# Patient Record
Sex: Female | Born: 1935 | Race: White | Hispanic: No | Smoking: Never smoker
Health system: Southern US, Community
[De-identification: ages and names within clinical notes are randomized; demographics above are authoritative.]

## PROBLEM LIST (undated history)

## (undated) DIAGNOSIS — F419 Anxiety disorder, unspecified: Secondary | ICD-10-CM

## (undated) DIAGNOSIS — F0390 Unspecified dementia without behavioral disturbance: Secondary | ICD-10-CM

## (undated) HISTORY — PX: TONSILLECTOMY: SUR1361

## (undated) HISTORY — PX: ABDOMINAL HYSTERECTOMY: SHX81

---

## 2015-07-02 ENCOUNTER — Emergency Department
Admission: EM | Admit: 2015-07-02 | Discharge: 2015-07-03 | Disposition: A | Discharge status: Home / Self Care | Payer: Medicare Other | Attending: Emergency Medicine | Admitting: Emergency Medicine

## 2015-07-02 ENCOUNTER — Encounter: Payer: Self-pay | Admitting: *Deleted

## 2015-07-02 ENCOUNTER — Emergency Department: Payer: Medicare Other

## 2015-07-02 DIAGNOSIS — G308 Other Alzheimer's disease: Secondary | ICD-10-CM | POA: Diagnosis not present

## 2015-07-02 DIAGNOSIS — Z046 Encounter for general psychiatric examination, requested by authority: Secondary | ICD-10-CM | POA: Diagnosis present

## 2015-07-02 DIAGNOSIS — F1310 Sedative, hypnotic or anxiolytic abuse, uncomplicated: Secondary | ICD-10-CM | POA: Diagnosis not present

## 2015-07-02 DIAGNOSIS — Z9183 Wandering in diseases classified elsewhere: Secondary | ICD-10-CM | POA: Diagnosis not present

## 2015-07-02 DIAGNOSIS — F0391 Unspecified dementia with behavioral disturbance: Secondary | ICD-10-CM | POA: Insufficient documentation

## 2015-07-02 DIAGNOSIS — F0281 Dementia in other diseases classified elsewhere with behavioral disturbance: Secondary | ICD-10-CM | POA: Diagnosis present

## 2015-07-02 DIAGNOSIS — N390 Urinary tract infection, site not specified: Secondary | ICD-10-CM | POA: Diagnosis not present

## 2015-07-02 DIAGNOSIS — G309 Alzheimer's disease, unspecified: Secondary | ICD-10-CM

## 2015-07-02 DIAGNOSIS — R451 Restlessness and agitation: Secondary | ICD-10-CM

## 2015-07-02 LAB — URINALYSIS COMPLETE WITH MICROSCOPIC (ARMC ONLY)
BILIRUBIN URINE: NEGATIVE
GLUCOSE, UA: NEGATIVE mg/dL
Ketones, ur: NEGATIVE mg/dL
Nitrite: NEGATIVE
PH: 6 (ref 5.0–8.0)
PROTEIN: 30 mg/dL — AB
Specific Gravity, Urine: 1.011 (ref 1.005–1.030)

## 2015-07-02 LAB — URINE DRUG SCREEN, QUALITATIVE (ARMC ONLY)
Amphetamines, Ur Screen: NOT DETECTED
BARBITURATES, UR SCREEN: NOT DETECTED
Benzodiazepine, Ur Scrn: POSITIVE — AB
COCAINE METABOLITE, UR ~~LOC~~: NOT DETECTED
Cannabinoid 50 Ng, Ur ~~LOC~~: NOT DETECTED
MDMA (Ecstasy)Ur Screen: NOT DETECTED
Methadone Scn, Ur: NOT DETECTED
OPIATE, UR SCREEN: NOT DETECTED
PHENCYCLIDINE (PCP) UR S: NOT DETECTED
Tricyclic, Ur Screen: NOT DETECTED

## 2015-07-02 LAB — COMPREHENSIVE METABOLIC PANEL
ALK PHOS: 72 U/L (ref 38–126)
ALT: 15 U/L (ref 14–54)
AST: 21 U/L (ref 15–41)
Albumin: 3.5 g/dL (ref 3.5–5.0)
Anion gap: 8 (ref 5–15)
BUN: 19 mg/dL (ref 6–20)
CHLORIDE: 104 mmol/L (ref 101–111)
CO2: 25 mmol/L (ref 22–32)
Calcium: 9 mg/dL (ref 8.9–10.3)
Creatinine, Ser: 0.93 mg/dL (ref 0.44–1.00)
GFR calc Af Amer: >60 mL/min (ref >60)
GFR calc non Af Amer: 54 mL/min — ABNORMAL LOW (ref >60)
GLUCOSE: 151 mg/dL — AB (ref 65–99)
Potassium: 3.5 mmol/L (ref 3.5–5.1)
SODIUM: 137 mmol/L (ref 135–145)
Total Bilirubin: 0.4 mg/dL (ref 0.3–1.2)
Total Protein: 7.3 g/dL (ref 6.5–8.1)

## 2015-07-02 LAB — SALICYLATE LEVEL

## 2015-07-02 LAB — CBC
HCT: 41.3 % (ref 35.0–47.0)
HEMOGLOBIN: 13.8 g/dL (ref 12.0–16.0)
MCH: 28 pg (ref 26.0–34.0)
MCHC: 33.3 g/dL (ref 32.0–36.0)
MCV: 84.1 fL (ref 80.0–100.0)
Platelets: 400 10*3/uL (ref 150–440)
RBC: 4.91 MIL/uL (ref 3.80–5.20)
RDW: 14 % (ref 11.5–14.5)
WBC: 8.4 10*3/uL (ref 3.6–11.0)

## 2015-07-02 LAB — ETHANOL: Alcohol, Ethyl (B): <5 mg/dL (ref <5)

## 2015-07-02 LAB — ACETAMINOPHEN LEVEL: Acetaminophen (Tylenol), Serum: <10 ug/mL — ABNORMAL LOW (ref 10–30)

## 2015-07-02 LAB — TROPONIN I

## 2015-07-02 MED ORDER — LORAZEPAM 1 MG PO TABS
ORAL_TABLET | ORAL | Status: AC
  Administered 2015-07-02: 1 mg via ORAL
  Filled 2015-07-02: qty 1

## 2015-07-02 MED ORDER — HALOPERIDOL 0.5 MG PO TABS
0.5000 mg | ORAL_TABLET | Freq: Three times a day (TID) | ORAL | Status: DC
  Administered 2015-07-03: 0.5 mg via ORAL
  Filled 2015-07-02 (×3): qty 1

## 2015-07-02 MED ORDER — LORAZEPAM 1 MG PO TABS
1.0000 mg | ORAL_TABLET | Freq: Two times a day (BID) | ORAL | Status: DC | PRN
  Administered 2015-07-02: 1 mg via ORAL

## 2015-07-02 MED ORDER — HALOPERIDOL 0.5 MG PO TABS
0.5000 mg | ORAL_TABLET | Freq: Four times a day (QID) | ORAL | Status: DC | PRN
  Filled 2015-07-02: qty 1

## 2015-07-02 MED ORDER — LORAZEPAM 0.5 MG PO TABS: 0.5000 mg | ORAL_TABLET | ORAL | Status: DC | PRN

## 2015-07-02 MED ORDER — CIPROFLOXACIN HCL 500 MG PO TABS
500.0000 mg | ORAL_TABLET | Freq: Two times a day (BID) | ORAL | Status: DC
  Administered 2015-07-02 – 2015-07-03 (×3): 500 mg via ORAL
  Filled 2015-07-02 (×3): qty 1

## 2015-07-02 MED ORDER — LORAZEPAM 0.5 MG PO TABS
0.5000 mg | ORAL_TABLET | Freq: Once | ORAL | Status: AC
  Administered 2015-07-02: 0.5 mg via ORAL
  Filled 2015-07-02: qty 1

## 2015-07-02 NOTE — ED Notes (Signed)
ENVIRONMENTAL ASSESSMENT Potentially harmful objects out of patient reach: No. Personal belongings secured: Yes.   Patient dressed in hospital provided attire only: Yes.   Plastic bags out of patient reach: Yes.   Patient care equipment (cords, cables, call bells, lines, and drains) shortened, removed, or accounted for: Yes.   Equipment and supplies removed from bottom of stretcher: Yes.   Potentially toxic materials out of patient reach: Yes.   Sharps container removed or out of patient reach: Yes.   
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
## 2015-07-02 NOTE — ED Notes (Signed)
Called daughter [phone].  Daughter stated pt. Has been here in [place] for 12 days.  Daughter stated pt. Was started on Remeron 7.5 mg five days ago.  Pt. Is also being treated for UTI, believes it is Macrobid.  Daughter states that is the only two medications the pt. Is on.  Pt. [facility] "Ruth DakinSarah Juarez".
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
## 2015-07-02 NOTE — ED Notes (Signed)
ED BHU PLACEMENT JUSTIFICATION Is the patient under IVC or is there intent for IVC: Yes.   Is the patient medically cleared: Yes.   Is there vacancy in the ED BHU: Yes.   Is the population mix appropriate for patient:  Is the patient awaiting placement in inpatient or outpatient setting: No. Has the patient had a psychiatric consult: No. Survey of unit performed for contraband, proper placement and condition of furniture, tampering with fixtures in bathroom, shower, and each patient room: Yes.  ; Findings:  APPEARANCE/BEHAVIOR adequate rapport can be established NEURO ASSESSMENT Orientation: person Hallucinations:  Speech: Normal Gait: unsteady RESPIRATORY ASSESSMENT Normal expansion.  Clear to auscultation.  No rales, rhonchi, or wheezing. CARDIOVASCULAR ASSESSMENT regular rate and rhythm, S1, S2 normal, no murmur, click, rub or gallop GASTROINTESTINAL ASSESSMENT soft, nontender, BS WNL, no r/g EXTREMITIES normal strength, tone, and muscle mass PLAN OF CARE Provide calm/safe environment. Vital signs assessed twice daily. ED BHU Assessment once each 12-hour shift. Collaborate with intake RN daily or as condition indicates. Assure the ED provider has rounded once each shift. Provide and encourage hygiene. Provide redirection as needed. Assess for escalating behavior; address immediately and inform ED provider.  Assess family dynamic and appropriateness for visitation as needed: Yes.  ; If necessary, describe findings:  Educate the patient/family about BHU procedures/visitation: Yes.  ; If necessary, describe findings:

## 2015-07-02 NOTE — ED Notes (Signed)
Pt daughter Sedalia MutaDiane [facility](POA) in to bring paper work on pt. POA papers and [facility]. Nurse makes copy and adds it to the chart.

## 2015-07-02 NOTE — ED Notes (Signed)

## 2015-07-02 NOTE — ED Notes (Signed)
BEHAVIORAL HEALTH ROUNDING Patient sleeping: No. Patient alert and oriented: yes Behavior appropriate: No.; If no, describe: pt wandering inappropriately Nutrition and fluids offered: Yes  Toileting and hygiene offered: Yes  Sitter present: no Law enforcement present: Yes

## 2015-07-02 NOTE — ED Notes (Addendum)
Pt is upset that she is unable to walk around freely. Pt is very steady on her feet and walks to and from bathroom by herself. Pt is expressive in her needs and lets aide and nurse know when she is hungry or needs something. Spoke with charge nurse about the possibility of placing her in bhu in order to allow her freedom. Also this nurse doesn't want to medicate her to keep her in the room since it may cause more confusion and create the chance of fall risk. Charge nurse and dr Peterson Aomallinda both agree and will arrange for pt to go to bhu

## 2015-07-02 NOTE — ED Notes (Signed)
Pt previously taken to restroom by staff. Pt assisted with ambulation to restroom by staff.

## 2015-07-02 NOTE — ED Notes (Signed)
Pt to door, confused, worried about paying bills and her dogs, tearful. Malinda MD informed, new verbal orders received.

## 2015-07-02 NOTE — ED Notes (Signed)
Pt sitting in dayroom talking rapidly to another pt.  The other pt is allowing her to speak and he will occasionally nod his head.

## 2015-07-02 NOTE — ED Notes (Signed)
[facility]  Health Tech told that ED Quad had no room for pt.  Housekeeping has been called to clean pt's room before she is returned to it because pt has urinated in her bed, the floor, and the chair.

## 2015-07-02 NOTE — ED Notes (Signed)
BEHAVIORAL HEALTH ROUNDING Patient sleeping: No. Patient alert and oriented: no, confused  Behavior appropriate: Yes.  ;  Nutrition and fluids offered: Yes  Toileting and hygiene offered: Yes  Sitter present: no Law enforcement present: Yes

## 2015-07-02 NOTE — ED Notes (Signed)
IVC 

## 2015-07-02 NOTE — ED Notes (Signed)
Pt at nurses station door trying to get door unlocked, pt walking into other pt room yelling at staff and stating "let me out"

## 2015-07-02 NOTE — ED Notes (Addendum)
Pt OOB to dayroom.  Pt is patting another pt on the shoulder and talking to her; personal space invasion.  Other pt putting her hands over her face.  This pt Kathie Dike(Muench) redirected.  She goes into another pt's room and begins making his bed.  Pt redirected.  Pt now standing in front of three pt's sitting in dayroom.  She approaches one of the female pt's and squeezes his elbow.  Female pt attempts to ignore her.  He looks away. This pt walks away and wanders into the first room again and begins making the bed again.  She is getting into the other pt's bed.  This pt redirected into her own room.

## 2015-07-02 NOTE — ED Notes (Signed)
Pt gait is increasingly unsteady.  Pt walks stiff-legged.

## 2015-07-02 NOTE — ED Notes (Signed)
BEHAVIORAL HEALTH ROUNDING Patient sleeping: Yes.   Patient alert and oriented: not applicable Behavior appropriate: Yes.  ; If no, describe:  Nutrition and fluids offered: No Toileting and hygiene offered: No Sitter present: yes Law enforcement present: Yes   

## 2015-07-02 NOTE — ED Notes (Signed)
Pt laying down in her room.  She says she is looking for her dogs.  She thinks one of them is on her pillow.

## 2015-07-02 NOTE — ED Notes (Signed)
Assessment of extremities (normal strength,  tone and muscle mass) is related to pt's age.  For an [age] yof, her extremities are WNL.

## 2015-07-02 NOTE — ED Notes (Signed)
Pt pounding on window and door of nurses' station.  Pt crying and yelling (pt doesn't yell very loudly) "Let me out! Why are you locking me in? I have to get out! I have to leave!"  She walks back and forth in front of the window and hits the glass.  Pt redirected.  Pt told she can't leave until she sees the doctor.
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
## 2015-07-02 NOTE — ED Notes (Signed)
Pt is currently being transported via wheelchair to ED Quad.

## 2015-07-02 NOTE — ED Provider Notes (Signed)
Patient's labs have come back troponin is negative she does have a urinary tract infection. It does not appear she is on any medications for that I will put her on Cipro for a few days to to take care of the problem  Arnaldo NatalPaul F Meerab Maselli, MD 07/02/15 [phone]

## 2015-07-02 NOTE — ED Notes (Signed)
Pt laying in her bed.  Pillow over her head.

## 2015-07-02 NOTE — ED Notes (Signed)
Pt wandering into another pt's room.  Pt redirected.

## 2015-07-02 NOTE — ED Notes (Addendum)
BEHAVIORAL HEALTH ROUNDING Patient sleeping: No. Patient alert and oriented: No Behavior appropriate: Yes.  ;  Nutrition and fluids offered: Yes  Toileting and hygiene offered: Yes  Sitter present: no Law enforcement present: Yes

## 2015-07-02 NOTE — ED Provider Notes (Signed)
Patient [facility] due to patient walking around. She has known dementia and wandering. Ativan administration is followed by urinating on her bed and becoming increasingly confused. The patient is returned to an ED acute treatment room for monitoring.  We'll continue to monitor the patient here until she returns to her baseline. Due to her dementia and wandering behavior, she is likely to become more upset and agitated and require more chemical sedation here in the acute treatment area of the ED rather than the BHU. In the BHU she would not require chemical sedation and could be allowed to move fairly freely within the locked area with understanding from staff that her confusion and dementia behavior is to be tolerated as her baseline unless she is doing anything to harm herself or others.  She would therefore be better served long-term in the BHU area where she has more mobility rather than being confined to a single treatment room.  I discontinued the Ativan prn order as she has not tolerated this medication well, and replaced it with a Haldol prn order which I feel is more appropriate for an 81 year old woman in consideration of the beers criteria.  Vital signs remained normal and the patient remains medically stable. We'll continue Cipro treatment for UTI.  Sharman CheekPhillip Briena Swingler, MD 07/02/15 [phone]

## 2015-07-02 NOTE — ED Notes (Addendum)
Pt upset.  She wanders from door to door saying she needs to get out.  RN guides pt to her room.  Pt wants to know why she needs a room.  RN explains that it is for her to rest.  Pt says she doesn't want to rest because she needs to get home to take care of her dogs.   She said that someone was going to take her dogs out and kill them.  RN attempted to reassure pt that pt's dogs were being cared for by pt's daughter.  Pt said that the dogs didn't like anybody else.  Pt walking around unit with RN attempting to talk with her.  Pt keeps saying that she has to get out.  "Why are you locking me in?"  pt asks RN.  RN explains that the pt will be seeing a doctor sometime today.  "Why do I need to see a doctor"  I'm not sick." says pt to RN.  RN explained that the doctor just wanted to make sure she was OK.  "How can he get away with that?  I haven't seen a doctor." pt to RN  RN explains that doctor is a woman and she is very nice.  Pt allows assessment while she is standing.  RN completes assessment and attempts to leave dayroom.  Pt follows RN to door.  RN explains that the pt is free to walk around, but has to extricate herself from pt.  Pt stands at door knocking.

## 2015-07-02 NOTE — ED Notes (Signed)
Pt to go to room bhu6

## 2015-07-02 NOTE — ED Notes (Signed)
Report received from [employer] sanders, rn.

## 2015-07-02 NOTE — ED Notes (Signed)
Pt moved back to quad - pt medicated earlier with ativan and had an episode of urinating in the common room and more confused. Pt placed in bed and tucked in after being given her cipro. Very cooperative and pleasant with this nurse. Aide is readily available in the quad.

## 2015-07-02 NOTE — ED Notes (Signed)
Dr Guss Bundehalla, having seen the pt, expressed intention to discuss status of pt in BHU with ED MD.  Per intake counselor, [place]/Goodson had agreed that pt should be returned to ED quad at an appropriate time.  Currently, however, pt is asleep.  Before this note was completed, pt was awake again, pulling off her pants.  Health tech is on the way.

## 2015-07-02 NOTE — ED Notes (Signed)
Pt going into another pt's room.  Redirected.

## 2015-07-02 NOTE — ED Notes (Signed)
ENVIRONMENTAL ASSESSMENT  Potentially harmful objects out of patient reach: Yes.  Personal belongings secured: Yes.  Patient dressed in hospital provided attire only: Yes.  Plastic bags out of patient reach: Yes.  Patient care equipment (cords, cables, call bells, lines, and drains) shortened, removed, or accounted for: Yes.  Equipment and supplies removed from bottom of stretcher: Yes.  Potentially toxic materials out of patient reach: Yes.  Sharps container removed or out of patient reach: Yes.   BEHAVIORAL HEALTH ROUNDING Patient sleeping: Yes.   Patient alert and oriented: not applicable SLEEPING Behavior appropriate: Yes.  ; If no, describe: SLEEPING Nutrition and fluids offered: No SLEEPING Toileting and hygiene offered: NoSLEEPING Sitter present: not applicable Law enforcement present: Yes ODS 
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
## 2015-07-02 NOTE — ED Notes (Signed)
Pt has urinated down side of bed per security. Pt sitting up in bed at this time conversing with rn. Pt states "i need a collie, do you have a collie, a little dog?" pt is alert to name only.

## 2015-07-02 NOTE — ED Notes (Signed)
Patient transported to CT 
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
## 2015-07-02 NOTE — ED Notes (Signed)
Pt wearing adult incontinence briefs.  She pulled them down in the hallway outside her room and began to urinate.  Health Tech quickly went to pt and guided her to bathroom where she voided a small amount in the toilet.

## 2015-07-02 NOTE — ED Notes (Signed)
Report to susan neal, rn. Pt assisted into wheelchair and transferred to room 20.

## 2015-07-02 NOTE — ED Notes (Signed)
Deputy reports that pt started new meds x 2 days ago. Reports that pt eloped from home last night also.

## 2015-07-03 DIAGNOSIS — F0281 Dementia in other diseases classified elsewhere with behavioral disturbance: Secondary | ICD-10-CM | POA: Diagnosis present

## 2015-07-03 DIAGNOSIS — G309 Alzheimer's disease, unspecified: Secondary | ICD-10-CM

## 2015-07-03 DIAGNOSIS — G308 Other Alzheimer's disease: Secondary | ICD-10-CM | POA: Diagnosis not present

## 2015-07-03 DIAGNOSIS — F0391 Unspecified dementia with behavioral disturbance: Secondary | ICD-10-CM | POA: Diagnosis not present

## 2015-07-03 DIAGNOSIS — N390 Urinary tract infection, site not specified: Secondary | ICD-10-CM | POA: Diagnosis present

## 2015-07-03 LAB — URINE CULTURE

## 2015-07-03 MED ORDER — RISPERIDONE 0.5 MG PO TABS
0.5000 mg | ORAL_TABLET | Freq: Two times a day (BID) | ORAL | Status: DC | PRN
Start: 2015-07-03 — End: 2015-07-03

## 2015-07-03 MED ORDER — ACETAMINOPHEN 325 MG PO TABS
650.0000 mg | ORAL_TABLET | Freq: Four times a day (QID) | ORAL | Status: DC | PRN
  Administered 2015-07-03: 650 mg via ORAL
  Filled 2015-07-03: qty 2

## 2015-07-03 MED ORDER — RISPERIDONE 0.5 MG PO TABS: 0.5000 mg | ORAL_TABLET | Freq: Two times a day (BID) | ORAL | Status: DC | PRN

## 2015-07-03 MED ORDER — FOSFOMYCIN TROMETHAMINE 3 G PO PACK
3.0000 g | PACK | ORAL | Status: AC
  Administered 2015-07-03: 3 g via ORAL
  Filled 2015-07-03: qty 3

## 2015-07-03 MED ORDER — CEPHALEXIN 500 MG PO CAPS: 500.0000 mg | ORAL_CAPSULE | Freq: Two times a day (BID) | ORAL | Status: DC

## 2015-07-03 NOTE — Discharge Instructions (Signed)
Urinary Tract Infection °Urinary tract infections (UTIs) can develop anywhere along your urinary tract. Your urinary tract is your body's drainage system for removing wastes and extra water. Your urinary tract includes two kidneys, two ureters, a bladder, and a urethra. Your kidneys are a pair of bean-shaped organs. Each kidney is about the size of your fist. They are located below your ribs, one on each side of your spine. °CAUSES °Infections are caused by microbes, which are microscopic organisms, including fungi, viruses, and bacteria. These organisms are so small that they can only be seen through a microscope. Bacteria are the microbes that most commonly cause UTIs. °SYMPTOMS  °Symptoms of UTIs may vary by age and gender of the patient and by the location of the infection. Symptoms in young women typically include a frequent and intense urge to urinate and a painful, burning feeling in the bladder or urethra during urination. Older women and men are more likely to be tired, shaky, and weak and have muscle aches and abdominal pain. A fever may mean the infection is in your kidneys. Other symptoms of a kidney infection include pain in your back or sides below the ribs, nausea, and vomiting. °DIAGNOSIS °To diagnose a UTI, your caregiver will ask you about your symptoms. Your caregiver also will ask to provide a urine sample. The urine sample will be tested for bacteria and white blood cells. White blood cells are made by your body to help fight infection. °TREATMENT  °Typically, UTIs can be treated with medication. Because most UTIs are caused by a bacterial infection, they usually can be treated with the use of antibiotics. The choice of antibiotic and length of treatment depend on your symptoms and the type of bacteria causing your infection. °HOME CARE INSTRUCTIONS °· If you were prescribed antibiotics, take them exactly as your caregiver instructs you. Finish the medication even if you feel better after you  have only taken some of the medication. °· Drink enough water and fluids to keep your urine clear or pale yellow. °· Avoid caffeine, tea, and carbonated beverages. They tend to irritate your bladder. °· Empty your bladder often. Avoid holding urine for long periods of time. °· Empty your bladder before and after sexual intercourse. °· After a bowel movement, women should cleanse from front to back. Use each tissue only once. °SEEK MEDICAL CARE IF:  °· You have back pain. °· You develop a fever. °· Your symptoms do not begin to resolve within 3 days. °SEEK IMMEDIATE MEDICAL CARE IF:  °· You have severe back pain or lower abdominal pain. °· You develop chills. °· You have nausea or vomiting. °· You have continued burning or discomfort with urination. °MAKE SURE YOU:  °· Understand these instructions. °· Will watch your condition. °· Will get help right away if you are not doing well or get worse. °Document Released: 09/11/2005 Document Revised: 06/02/2012 Document Reviewed: 01/10/2012 °ExitCare® Patient Information ©2015 [employer]. This information is not intended to replace advice given to you by your health care provider. Make sure you discuss any questions you have with your health care provider. °Dementia °Dementia is a general term for problems with brain function. A person with dementia has memory loss and a hard time with at least one other brain function such as thinking, speaking, or problem solving. Dementia can affect social functioning, how you do your job, your mood, or your personality. The changes may be hidden for a long time. The earliest forms of this disease are   usually not detected by family or friends. °Dementia can be: °· Irreversible. °· Potentially reversible. °· Partially reversible. °· Progressive. This means it can get worse over time. °CAUSES  °Irreversible dementia causes may include: °· Degeneration of brain cells (Alzheimer disease or Lewy body dementia). °· Multiple small strokes  (vascular dementia). °· Infection (chronic meningitis or Creutzfeldt-Jakob disease). °· Frontotemporal dementia. This affects younger people, age 40 to 70, compared to those who have Alzheimer disease. °· Dementia associated with other disorders like Parkinson disease, Huntington disease, or HIV-associated dementia. °Potentially or partially reversible dementia causes may include: °· Medicines. °· Metabolic causes such as excessive alcohol intake, vitamin B12 deficiency, or thyroid disease. °· Masses or pressure in the brain such as a tumor, blood clot, or hydrocephalus. °SIGNS AND SYMPTOMS  °Symptoms are often hard to detect. Family members or coworkers may not notice them early in the disease process. Different people with dementia may have different symptoms. Symptoms can include: °· A hard time with memory, especially recent memory. Long-term memory may not be impaired. °· Asking the same question multiple times or forgetting something someone just said. °· A hard time speaking your thoughts or finding certain words. °· A hard time solving problems or performing familiar tasks (such as how to use a telephone). °· Sudden changes in mood. °· Changes in personality, especially increasing moodiness or mistrust. °· Depression. °· A hard time understanding complex ideas that were never a problem in the past. °DIAGNOSIS  °There are no specific tests for dementia.  °· Your health care provider may recommend a thorough evaluation. This is because some forms of dementia can be reversible. The evaluation will likely include a physical exam and getting a detailed history from you and a family member. The history often gives the best clues and suggestions for a diagnosis. °· Memory testing may be done. A detailed brain function evaluation called neuropsychologic testing may be helpful. °· Lab tests and brain imaging (such as a CT scan or MRI scan) are sometimes important. °· Sometimes observation and re-evaluation over time  is very helpful. °TREATMENT  °Treatment depends on the cause.  °· If the problem is a vitamin deficiency, it may be helped or cured with supplements. °· For dementias such as Alzheimer disease, medicines are available to stabilize or slow the course of the disease. There are no cures for this type of dementia. °· Your health care provider can help direct you to groups, organizations, and other health care providers to help with decisions in the care of you or your loved one. °HOME CARE INSTRUCTIONS °The care of individuals with dementia is varied and dependent upon the progression of the dementia. The following suggestions are intended for the person living with, or caring for, the person with dementia. °· Create a safe environment. °¨ Remove the locks on bathroom doors to prevent the person from accidentally locking himself or herself in. °¨ Use childproof latches on kitchen cabinets and any place where cleaning supplies, chemicals, or alcohol are kept. °¨ Use childproof covers in unused electrical outlets. °¨ Install childproof devices to keep doors and windows secured. °¨ Remove stove knobs or install safety knobs and an automatic shut-off on the stove. °¨ Lower the temperature on water heaters. °¨ Label medicines and keep them locked up. °¨ Secure knives, lighters, matches, power tools, and guns, and keep these items out of reach. °¨ Keep the house free from clutter. Remove rugs or anything that might contribute to a fall. °¨ Remove   objects that might break and hurt the person. °¨ Make sure lighting is good, both inside and outside. °¨ Install grab rails as needed. °¨ Use a monitoring device to alert you to falls or other needs for help. °· Reduce confusion. °¨ Keep familiar objects and people around. °¨ Use night lights or dim lights at night. °¨ Label items or areas. °¨ Use reminders, notes, or directions for daily activities or tasks. °¨ Keep a simple, consistent routine for waking, meals, bathing, dressing,  and bedtime. °¨ Create a calm, quiet environment. °¨ Place large clocks and calendars prominently. °¨ Display emergency numbers and home address near all telephones. °¨ Use cues to establish different times of the day. An example is to open curtains to let the natural light in during the day.   °· Use effective communication. °¨ Choose simple words and short sentences. °¨ Use a gentle, calm tone of voice. °¨ Be careful not to interrupt. °¨ If the person is struggling to find a word or communicate a thought, try to provide the word or thought. °¨ Ask one question at a time. Allow the person ample time to answer questions. Repeat the question again if the person does not respond. °· Reduce nighttime restlessness. °¨ Provide a comfortable bed. °¨ Have a consistent nighttime routine. °¨ Ensure a regular walking or physical activity schedule. Involve the person in daily activities as much as possible. °¨ Limit napping during the day. °¨ Limit caffeine. °¨ Attend social events that stimulate rather than overwhelm the senses. °· Encourage good nutrition and hydration. °¨ Reduce distractions during meal times and snacks. °¨ Avoid foods that are too hot or too cold. °¨ Monitor chewing and swallowing ability. °· Continue with routine vision, hearing, dental, and medical screenings. °· Give medicines only as directed by the health care provider. °· Monitor driving abilities. Do not allow the person to drive when safe driving is no longer possible. °· Register with an identification program which could provide location assistance in the event of a missing person situation. °SEEK MEDICAL CARE IF:  °· New behavioral problems start such as moodiness, aggressiveness, or seeing things that are not there (hallucinations). °· Any new problem with brain function happens. This includes problems with balance, speech, or falling a lot. °· Problems with swallowing develop. °· Any symptoms of other illness happen. °Small changes or  worsening in any aspect of brain function can be a sign that the illness is getting worse. It can also be a sign of another medical illness such as infection. Seeing a health care provider right away is important. °SEEK IMMEDIATE MEDICAL CARE IF:  °· A fever develops. °· New or worsened confusion develops. °· New or worsened sleepiness develops. °· Staying awake becomes hard to do. °Document Released: 05/28/2001 Document Revised: 04/18/2014 Document Reviewed: 04/29/2011 °ExitCare® Patient Information ©2015 [employer]. This information is not intended to replace advice given to you by your health care provider. Make sure you discuss any questions you have with your health care provider. ° °

## 2015-07-03 NOTE — ED Notes (Signed)
BEHAVIORAL HEALTH ROUNDING Patient sleeping: No. Patient alert and oriented: no Behavior appropriate: Yes.  ; If no, describe:  Nutrition and fluids offered: Yes  Toileting and hygiene offered: Yes  Sitter present: yes Law enforcement present: Yes  

## 2015-07-03 NOTE — ED Notes (Addendum)
BEHAVIORAL HEALTH ROUNDING Patient sleeping: Yes.   Patient alert and oriented: not applicable SLEEPING Behavior appropriate: Yes.  ; If no, describe: SLEEPING Nutrition and fluids offered: No SLEEPING Toileting and hygiene offered: NoSLEEPING Sitter present: not applicable Law enforcement present: Yes ODS 

## 2015-07-03 NOTE — ED Notes (Signed)
BEHAVIORAL HEALTH ROUNDING Patient sleeping: Yes.   Patient alert and oriented: not applicable SLEEPING Behavior appropriate: Yes.  ; If no, describe: SLEEPING Nutrition and fluids offered: No SLEEPING Toileting and hygiene offered: NoSLEEPING Sitter present: not applicable Law enforcement present: Yes ODS 

## 2015-07-03 NOTE — ED Notes (Addendum)
BEHAVIORAL HEALTH ROUNDING Patient sleeping: No. Patient alert and oriented: no Behavior appropriate: Yes.  ; If no, describe:  Nutrition and fluids offered: Yes  Toileting and hygiene offered: Yes  Sitter present: yes Law enforcement present: Yes  

## 2015-07-03 NOTE — ED Provider Notes (Signed)
Patient seen in consultation by Dr. Toni Amendlapacs who recommends discharge and the care of the patient's daughter. Discussed with the patient's daughter and she is comfortable with the plan of care including changing her medication over to Risperdal and following up with her current in-home health services.  Dr. Toni Amendlapacs providing prescription for Risperdal. I will DC cipro because of the high risk of this treatment failing in geriatric population. We'll instead prescribed Keflex.  Patient vital signs are stable she is calm and in no distress. Discharge to home.  Ruth CreamerMark Cordaro Mukai, MD 07/03/15 [phone]

## 2015-07-03 NOTE — ED Provider Notes (Signed)
-----------------------------------------   1:36 PM on 07/03/2015 -----------------------------------------  She remained stable during the shift. She does have a UTI, was placed on Cipro. We do not have culture data yet, but does show mixed growth. I will give the patient a single dose of fosfomycin in addition to her Cipro because of local resistance profiles while we await final culture data. We continue to await reevaluation by psychiatry service at this time.  Sharyn CreamerMark Erleen Egner, MD 07/03/15 [phone]

## 2015-07-03 NOTE — ED Provider Notes (Signed)
-----------------------------------------   5:23 AM on 07/03/2015 -----------------------------------------   BP 129/80 mmHg  Pulse 86  Temp(Src) 99.1 F (37.3 C) (Oral)  Resp 18  Ht 5\' 3"  (1.6 m)  Wt 125 lb (56.7 kg)  BMI 22.15 kg/m2  SpO2 99%  I have seen the patient and reviewed the patients recent care with the nursing staff.  The patient has had no acute events since last update by Dr. Scotty CourtStafford when the patient was returned from the behavioral health unit to the acute care area has been .  The patient calm and cooperative.  Disposition is pending per Psychiatry/Behavioral Medicine recommendations and availability of a long-term care team.   Darien Ramusavid W Candida Vetter, MD 07/03/15 [phone]

## 2015-07-03 NOTE — ED Notes (Signed)
BEHAVIORAL HEALTH ROUNDING Patient sleeping: Yes.   Patient alert and oriented: not applicable Behavior appropriate: Yes.  ; If no, describe:  Nutrition and fluids offered: Yes  Toileting and hygiene offered: Yes  Sitter present: yes Law enforcement present: Yes   Pt sleeping at this time.

## 2015-07-03 NOTE — Consult Note (Addendum)
[facility] Consult   Reason for Consult:  Follow-up for this 81 year old woman with a history of dementia and agitation Referring Physician:  Quale Patient Identification: Ruth Juarez MRN:  [id number] Principal Diagnosis: Alzheimer's dementia with behavioral disturbance Diagnosis:   Patient Active Problem List   Diagnosis Date Noted  . Alzheimer's dementia with behavioral disturbance [G30.8] 07/03/2015  . Urinary tract infection [N39.0] 07/03/2015    Total Time spent with patient: 30 minutes  Subjective:   Ruth Juarez is a 81 y.o. female patient admitted with "I can't find my dogs".  HPI:  Information from the patient and the chart. 81 year old woman with a history of dementia was brought to [place] by her daughter. Patient had been living independently in [place] but her dementia had gotten to the point that she was unable to safely live at home. Apparently the authorities in [place] were going to forcibly place her into a nursing home unless the daughter agreed to take the patient instead. The daughter prefers to try to give it a shot to the care of her mother at home. Since coming down here she initially found the mother's behavior tolerable but after taking her to the clinic for her urinary tract infection for the last 3 days the patient has been more agitated. She complains constantly about not being able to find her dogs. She is unable to understand that she no longer lives in her old home or has her dogs with her. Patient has not made any suicidal or homicidal statements.  Past psychiatric history: According to the daughter the patient has "always been mean" but has never had any diagnosed or treated psychiatric problems in the past no history of diagnosed depression or suicidal behavior. Medical history: Urinary tract infection currently being treated. Cultures just came back for Escherichia coli which may necessitate a change to treatment  depending on susceptibility.  Family history: Unknown  Substance abuse history: No substance abuse  Social history: Demented woman was living independently in [place] but was unable to care for herself. Daughter has agreed to try taking care of her mother here in [place]. The daughter prefers not to have the patient put in a nursing home if possible. I believe the daughter has power of attorney.  Update as of today: Daughter reports that the patient's behavior has not been violent or threatening today. She feels comfortable taking the patient home. She is primarily concerned that if the patient's behavior worsens at home that she have some kind of medication that might be helpful in controlling the patient. Patient is not able to give any useful or meaningful history herself. HPI Elements:   Quality:  Dementia with agitation. Severity:  Moderate. Timing:  Worse and apparently particularly bad the last few days. Duration:  Intermittent. Could be related to the UTI. Context:  Recent move out of her home. Recent urinary tract infection..  Past Medical History: History reviewed. No pertinent past medical history.  Past Surgical History  Procedure Laterality Date  . Abdominal hysterectomy    . Tonsillectomy     Family History: History reviewed. No pertinent family history. Social History:  History  Alcohol Use No    Comment: Patient denies      History  Drug Use No    Comment: Patient denies    History   Social History  . Marital Status: Widowed    Spouse Name: N/A  . Number of Children: N/A  . Years of Education: N/A  Social History Main Topics  . Smoking status: Never Smoker   . Smokeless tobacco: Not on file  . Alcohol Use: No     Comment: Patient denies   . Drug Use: No     Comment: Patient denies  . Sexual Activity: No   Other Topics Concern  . None   Social History Narrative  . None   Additional Social History:    History of alcohol / drug use?:  No history of alcohol / drug abuse                     Allergies:  No Known Allergies  Labs:  Results for orders placed or performed during the hospital encounter of 07/02/15 (from the past 48 hour(s))  Comprehensive metabolic panel     Status: Abnormal   Collection Time: 07/02/15  5:26 AM  Result Value Ref Range   Sodium 137 135 - 145 mmol/L   Potassium 3.5 3.5 - 5.1 mmol/L   Chloride 104 101 - 111 mmol/L   CO2 25 22 - 32 mmol/L   Glucose, Bld 151 (H) 65 - 99 mg/dL   BUN 19 6 - 20 mg/dL   Creatinine, Ser 0.93 0.44 - 1.00 mg/dL   Calcium 9.0 8.9 - 10.3 mg/dL   Total Protein 7.3 6.5 - 8.1 g/dL   Albumin 3.5 3.5 - 5.0 g/dL   AST 21 15 - 41 U/L   ALT 15 14 - 54 U/L   Alkaline Phosphatase 72 38 - 126 U/L   Total Bilirubin 0.4 0.3 - 1.2 mg/dL   GFR calc non Af Amer 54 (L) >60 mL/min   GFR calc Af Amer >60 >60 mL/min    Comment: (NOTE) The eGFR has been calculated using the CKD EPI equation. This calculation has not been validated in all clinical situations. eGFR's persistently <60 mL/min signify possible Chronic Kidney Disease.    Anion gap 8 5 - 15  Ethanol (ETOH)     Status: None   Collection Time: 07/02/15  5:26 AM  Result Value Ref Range   Alcohol, Ethyl (B) <5 <5 mg/dL    Comment:        LOWEST DETECTABLE LIMIT FOR SERUM ALCOHOL IS 5 mg/dL FOR MEDICAL PURPOSES ONLY   Salicylate level     Status: None   Collection Time: 07/02/15  5:26 AM  Result Value Ref Range   Salicylate Lvl <3.3 2.8 - 30.0 mg/dL  Acetaminophen level     Status: Abnormal   Collection Time: 07/02/15  5:26 AM  Result Value Ref Range   Acetaminophen (Tylenol), Serum <10 (L) 10 - 30 ug/mL    Comment:        THERAPEUTIC CONCENTRATIONS VARY SIGNIFICANTLY. A RANGE OF 10-30 ug/mL MAY BE AN EFFECTIVE CONCENTRATION FOR MANY PATIENTS. HOWEVER, SOME ARE BEST TREATED AT CONCENTRATIONS OUTSIDE THIS RANGE. ACETAMINOPHEN CONCENTRATIONS >150 ug/mL AT 4 HOURS AFTER INGESTION AND >50 ug/mL AT  12 HOURS AFTER INGESTION ARE OFTEN ASSOCIATED WITH TOXIC REACTIONS.   CBC     Status: None   Collection Time: 07/02/15  5:26 AM  Result Value Ref Range   WBC 8.4 3.6 - 11.0 K/uL   RBC 4.91 3.80 - 5.20 MIL/uL   Hemoglobin 13.8 12.0 - 16.0 g/dL   HCT 41.3 35.0 - 47.0 %   MCV 84.1 80.0 - 100.0 fL   MCH 28.0 26.0 - 34.0 pg   MCHC 33.3 32.0 - 36.0 g/dL   RDW 14.0 11.5 -  14.5 %   Platelets 400 150 - 440 K/uL  Urine Drug Screen, Qualitative ([facility] only)     Status: Abnormal   Collection Time: 07/02/15  5:26 AM  Result Value Ref Range   Tricyclic, Ur Screen NONE DETECTED NONE DETECTED   Amphetamines, Ur Screen NONE DETECTED NONE DETECTED   MDMA (Ecstasy)Ur Screen NONE DETECTED NONE DETECTED   Cocaine Metabolite,Ur Granite NONE DETECTED NONE DETECTED   Opiate, Ur Screen NONE DETECTED NONE DETECTED   Phencyclidine (PCP) Ur S NONE DETECTED NONE DETECTED   Cannabinoid 50 Ng, Ur Catharine NONE DETECTED NONE DETECTED   Barbiturates, Ur Screen NONE DETECTED NONE DETECTED   Benzodiazepine, Ur Scrn POSITIVE (A) NONE DETECTED   Methadone Scn, Ur NONE DETECTED NONE DETECTED    Comment: (NOTE) 277  Tricyclics, urine               Cutoff 1000 ng/mL 200  Amphetamines, urine             Cutoff 1000 ng/mL 300  MDMA (Ecstasy), urine           Cutoff 500 ng/mL 400  Cocaine Metabolite, urine       Cutoff 300 ng/mL 500  Opiate, urine                   Cutoff 300 ng/mL 600  Phencyclidine (PCP), urine      Cutoff 25 ng/mL 700  Cannabinoid, urine              Cutoff 50 ng/mL 800  Barbiturates, urine             Cutoff 200 ng/mL 900  Benzodiazepine, urine           Cutoff 200 ng/mL 1000 Methadone, urine                Cutoff 300 ng/mL 1100 1200 The urine drug screen provides only a preliminary, unconfirmed 1300 analytical test result and should not be used for non-medical 1400 purposes. Clinical consideration and professional judgment should 1500 be applied to any positive drug screen result due to possible 1600  interfering substances. A more specific alternate chemical method 1700 must be used in order to obtain a confirmed analytical result.  1800 Gas chromato graphy / mass spectrometry (GC/MS) is the preferred 1900 confirmatory method.   Troponin I     Status: None   Collection Time: 07/02/15  5:26 AM  Result Value Ref Range   Troponin I <0.03 <0.031 ng/mL    Comment:        NO INDICATION OF MYOCARDIAL INJURY.   Urinalysis complete, with microscopic ([facility] only)     Status: Abnormal   Collection Time: 07/02/15  5:26 AM  Result Value Ref Range   Color, Urine YELLOW (A) YELLOW   APPearance CLEAR (A) CLEAR   Glucose, UA NEGATIVE NEGATIVE mg/dL   Bilirubin Urine NEGATIVE NEGATIVE   Ketones, ur NEGATIVE NEGATIVE mg/dL   Specific Gravity, Urine 1.011 1.005 - 1.030   Hgb urine dipstick 1+ (A) NEGATIVE   pH 6.0 5.0 - 8.0   Protein, ur 30 (A) NEGATIVE mg/dL   Nitrite NEGATIVE NEGATIVE   Leukocytes, UA TRACE (A) NEGATIVE   RBC / HPF 0-5 0 - 5 RBC/hpf   WBC, UA 6-30 0 - 5 WBC/hpf   Bacteria, UA RARE (A) NONE SEEN   Squamous Epithelial / LPF 0-5 (A) NONE SEEN   Mucous PRESENT    Hyaline Casts, UA PRESENT  Urine culture     Status: None   Collection Time: 07/02/15  5:26 AM  Result Value Ref Range   Specimen Description URINE, RANDOM    Special Requests      just started on cipro after present specimen Immunocompromised   Culture MIXED BACTERIAL ORGANISMS    Report Status 07/03/2015 FINAL     Vitals: Blood pressure 129/93, pulse 83, temperature 98.8 F (37.1 C), temperature source Oral, resp. rate 16, height 5' 3"  (1.6 m), weight 56.7 kg (125 lb), SpO2 100 %.  Risk to Self: Suicidal Ideation: No Suicidal Intent: No Is patient at risk for suicide?: No Suicidal Plan?: No Access to Means: No What has been your use of drugs/alcohol within the last 12 months?: None How many times?: 0 Triggers for Past Attempts: None known Intentional Self Injurious Behavior: None Risk to Others:  Homicidal Ideation: No Thoughts of Harm to Others: No Current Homicidal Intent: No Current Homicidal Plan: No Access to Homicidal Means: No Identified Victim: None History of harm to others?: No Assessment of Violence: None Noted Violent Behavior Description: Pt's daughter states that patient becomes aggressive towards her  Does patient have access to weapons?: No Criminal Charges Pending?: No Does patient have a court date: No Prior Inpatient Therapy: Prior Inpatient Therapy: No Prior Outpatient Therapy: Prior Outpatient Therapy: No  Current Facility-Administered Medications  Medication Dose Route Frequency Provider Last Rate Last Dose  . acetaminophen (TYLENOL) tablet 650 mg  650 mg Oral Q6H PRN Ahmed Prima, MD   650 mg at 07/03/15 [phone]  . ciprofloxacin (CIPRO) tablet 500 mg  500 mg Oral BID Nena Polio, MD   500 mg at 07/03/15 0857  . fosfomycin (MONUROL) packet 3 g  3 g Oral STAT Delman Kitten, MD      . haloperidol (HALDOL) tablet 0.5 mg  0.5 mg Oral TID Dewain Penning, MD   0.5 mg at 07/03/15 0856  . haloperidol (HALDOL) tablet 0.5 mg  0.5 mg Oral Q6H PRN Carrie Mew, MD       Current Outpatient Prescriptions  Medication Sig Dispense Refill  . cephALEXin (KEFLEX) 500 MG capsule Take 1 capsule (500 mg total) by mouth 2 (two) times daily. 14 capsule 0  . risperiDONE (RISPERDAL) 0.5 MG tablet Take 1 tablet (0.5 mg total) by mouth 2 (two) times daily as needed (agitation). 60 tablet 0    Musculoskeletal: Strength & Muscle Tone: within normal limits Gait & Station: shuffle Patient leans: N/A  Psychiatric Specialty Exam: Physical Exam  Constitutional: She appears well-developed and well-nourished.  HENT:  Head: Normocephalic and atraumatic.  Eyes: Conjunctivae are normal. Pupils are equal, round, and reactive to light.  Neck: Normal range of motion.  Cardiovascular: Normal heart sounds.   Respiratory: Effort normal.  GI: Soft.  Musculoskeletal: Normal range of  motion.  Neurological: She is alert.  Skin: Skin is warm and dry.  Psychiatric: Her mood appears anxious. Her affect is labile. Her speech is tangential. She is agitated. Thought content is paranoid. Cognition and memory are impaired. She expresses impulsivity. She exhibits abnormal recent memory and abnormal remote memory.  Agent appears to be confused and is clearly demented. Her affect however does not seem to be sad and she doesn't make any statements that would be typical of depression.    Review of Systems  Constitutional: Negative.   HENT: Negative.   Eyes: Negative.   Respiratory: Negative.   Cardiovascular: Negative.   Gastrointestinal: Negative.   Musculoskeletal: Negative.  Skin: Negative.   Neurological: Negative.   Psychiatric/Behavioral: Positive for memory loss. Negative for depression, suicidal ideas, hallucinations and substance abuse. The patient is nervous/anxious and has insomnia.     Blood pressure 129/93, pulse 83, temperature 98.8 F (37.1 C), temperature source Oral, resp. rate 16, height 5' 3"  (1.6 m), weight 56.7 kg (125 lb), SpO2 100 %.Body mass index is 22.15 kg/(m^2).  General Appearance: Disheveled  Eye Sport and exercise psychologist::  Fair  Speech:  Garbled, Slow and Slurred  Volume:  Decreased  Mood:  Anxious  Affect:  Full Range  Thought Process:  Tangential  Orientation:  Negative  Thought Content:  Delusions  Suicidal Thoughts:  No  Homicidal Thoughts:  No  Memory:  Immediate;   Poor Recent;   Poor Remote;   Poor  Judgement:  Poor  Insight:  Lacking  Psychomotor Activity:  Shuffling Gait  Concentration:  Poor  Recall:  Poor  Fund of Knowledge:Poor  Language: Fair  Akathisia:  No  Handed:  Right  AIMS (if indicated):     Assets:  Physical Health Social Support  ADL's:  Impaired  Cognition: Impaired,  Moderate  Sleep:      Medical Decision Making: Review of Psycho-Social Stressors (1), Established Problem, Worsening (2), Review of Medication Regimen &  Side Effects (2) and Review of New Medication or Change in Dosage (2)  Treatment Plan Summary: Medication management and Plan Patient suffers from dementia probably primarily Alzheimer's disease with possible other contraindicating factors. Her behavior has not been actually physically dangerous to herself or others. She has wondering and confusion which are typical of this degree of dementia. She is not likely to benefit from admission to a geriatric psychiatry unit. Daughter is convinced that she wants to try taking care of the patient at home but may find this to be impossible. Case we will discontinue the mirtazapine as I do not think the patient is depressed and instead start 0.5 mg of Risperdal twice a day as needed for agitation. Side effects discussed with the daughter. Prescription written out. Case discussed with emergency room physician. They will follow-up with local providers here. Patient can now be released from the emergency room. No commitment papers are in place.  Plan:  Patient does not meet criteria for psychiatric inpatient admission. Discussed crisis plan, support from social network, calling 911, coming to the Emergency Department, and calling Suicide Hotline. Disposition: Discharge on new medication follow-up outpatient  Mikhi Athey 07/03/2015 2:19 PM

## 2015-07-03 NOTE — Progress Notes (Signed)
CSW spoke with the patient's daughter who reports she is willing to care for her mother full-time.  She reports her [place] was relocated here [place] she was living alone with her 10 dogs.  The family was contacted by [facility] Social [place] because the patient was unable to care for herself alone.  DSS informed the family if no one was willing to provide the care for their mother the agency would petition for guardianship therefore she would be placed in a facility.  The patient's daughter Sedalia MutaDiane retired from her job to move her mother in her home so she could provide the primary care.   The patient was [facility] her [facility] care [facility].  The daughter reports having an appointment with [facility] on 07/04/2015 for an assessment of resource options.    Once the patient is medically cleared for discharge she will return to her daughter's care.    Maryelizabeth Rowanressa Londa Mackowski, MSW, LCSW, LCAS Clinical Social Worker [phone]

## 2015-07-03 NOTE — ED Notes (Signed)
Pt assisted to bathroom by tech. Pt had large soft brown stool in her Depends. Pt assisted with cleaning up and returned to be. Pt was calm and cooperative. No agitation noted.

## 2015-07-03 NOTE — ED Notes (Signed)
Pt co pain to her joints, especially to her right thumb. Dr Carollee MassedKaminski informed and new order received for tylenol.

## 2015-07-04 ENCOUNTER — Emergency Department
Admission: EM | Admit: 2015-07-04 | Discharge: 2015-07-07 | Disposition: A | Discharge status: Other Acute Inpatient Hospital | Payer: Medicare Other | Attending: Emergency Medicine | Admitting: Emergency Medicine

## 2015-07-04 ENCOUNTER — Encounter: Payer: Self-pay | Admitting: Emergency Medicine

## 2015-07-04 DIAGNOSIS — F0281 Dementia in other diseases classified elsewhere with behavioral disturbance: Secondary | ICD-10-CM | POA: Diagnosis present

## 2015-07-04 DIAGNOSIS — N390 Urinary tract infection, site not specified: Secondary | ICD-10-CM | POA: Diagnosis present

## 2015-07-04 DIAGNOSIS — F02818 Dementia in other diseases classified elsewhere, unspecified severity, with other behavioral disturbance: Secondary | ICD-10-CM | POA: Diagnosis present

## 2015-07-04 DIAGNOSIS — G308 Other Alzheimer's disease: Secondary | ICD-10-CM | POA: Diagnosis not present

## 2015-07-04 DIAGNOSIS — F1310 Sedative, hypnotic or anxiolytic abuse, uncomplicated: Secondary | ICD-10-CM | POA: Diagnosis not present

## 2015-07-04 DIAGNOSIS — Z79899 Other long term (current) drug therapy: Secondary | ICD-10-CM | POA: Insufficient documentation

## 2015-07-04 DIAGNOSIS — F0390 Unspecified dementia without behavioral disturbance: Secondary | ICD-10-CM | POA: Insufficient documentation

## 2015-07-04 DIAGNOSIS — Z008 Encounter for other general examination: Secondary | ICD-10-CM | POA: Diagnosis present

## 2015-07-04 DIAGNOSIS — Z792 Long term (current) use of antibiotics: Secondary | ICD-10-CM | POA: Diagnosis not present

## 2015-07-04 DIAGNOSIS — G309 Alzheimer's disease, unspecified: Secondary | ICD-10-CM

## 2015-07-04 LAB — CBC WITH DIFFERENTIAL/PLATELET
Basophils Absolute: 0.1 10*3/uL (ref 0–0.1)
Basophils Relative: 1 %
EOS ABS: 0.1 10*3/uL (ref 0–0.7)
Eosinophils Relative: 1 %
HCT: 41.1 % (ref 35.0–47.0)
Hemoglobin: 13.5 g/dL (ref 12.0–16.0)
Lymphocytes Relative: 17 %
Lymphs Abs: 1.5 10*3/uL (ref 1.0–3.6)
MCH: 27.9 pg (ref 26.0–34.0)
MCHC: 32.8 g/dL (ref 32.0–36.0)
MCV: 85.1 fL (ref 80.0–100.0)
Monocytes Absolute: 0.8 10*3/uL (ref 0.2–0.9)
Monocytes Relative: 9 %
NEUTROS ABS: 6.3 10*3/uL (ref 1.4–6.5)
Neutrophils Relative %: 72 %
Platelets: 434 10*3/uL (ref 150–440)
RBC: 4.83 MIL/uL (ref 3.80–5.20)
RDW: 14.3 % (ref 11.5–14.5)
WBC: 8.8 10*3/uL (ref 3.6–11.0)

## 2015-07-04 LAB — URINALYSIS COMPLETE WITH MICROSCOPIC (ARMC ONLY)
Bilirubin Urine: NEGATIVE
Glucose, UA: NEGATIVE mg/dL
Hgb urine dipstick: NEGATIVE
Ketones, ur: NEGATIVE mg/dL
Leukocytes, UA: NEGATIVE
Nitrite: NEGATIVE
Protein, ur: NEGATIVE mg/dL
Specific Gravity, Urine: 1.016 (ref 1.005–1.030)
pH: 6 (ref 5.0–8.0)

## 2015-07-04 LAB — URINE DRUG SCREEN, QUALITATIVE (ARMC ONLY)
AMPHETAMINES, UR SCREEN: NOT DETECTED
BENZODIAZEPINE, UR SCRN: POSITIVE — AB
Barbiturates, Ur Screen: NOT DETECTED
CANNABINOID 50 NG, UR ~~LOC~~: NOT DETECTED
Cocaine Metabolite,Ur ~~LOC~~: NOT DETECTED
MDMA (Ecstasy)Ur Screen: NOT DETECTED
Methadone Scn, Ur: NOT DETECTED
Opiate, Ur Screen: NOT DETECTED
PHENCYCLIDINE (PCP) UR S: NOT DETECTED
TRICYCLIC, UR SCREEN: NOT DETECTED

## 2015-07-04 LAB — COMPREHENSIVE METABOLIC PANEL
ALBUMIN: 3.6 g/dL (ref 3.5–5.0)
ALK PHOS: 71 U/L (ref 38–126)
ALT: 14 U/L (ref 14–54)
AST: 24 U/L (ref 15–41)
Anion gap: 8 (ref 5–15)
BILIRUBIN TOTAL: 0.2 mg/dL — AB (ref 0.3–1.2)
BUN: 22 mg/dL — ABNORMAL HIGH (ref 6–20)
CHLORIDE: 107 mmol/L (ref 101–111)
CO2: 26 mmol/L (ref 22–32)
CREATININE: 1.12 mg/dL — AB (ref 0.44–1.00)
Calcium: 9.5 mg/dL (ref 8.9–10.3)
GFR calc Af Amer: 50 mL/min — ABNORMAL LOW (ref >60)
GFR calc non Af Amer: 44 mL/min — ABNORMAL LOW (ref >60)
GLUCOSE: 149 mg/dL — AB (ref 65–99)
POTASSIUM: 4.4 mmol/L (ref 3.5–5.1)
Sodium: 141 mmol/L (ref 135–145)
Total Protein: 7.3 g/dL (ref 6.5–8.1)

## 2015-07-04 LAB — ETHANOL

## 2015-07-04 MED ORDER — ZIPRASIDONE HCL 20 MG PO CAPS
20.0000 mg | ORAL_CAPSULE | Freq: Once | ORAL | Status: AC
  Administered 2015-07-04: 20 mg via ORAL

## 2015-07-04 MED ORDER — ZIPRASIDONE HCL 20 MG PO CAPS
ORAL_CAPSULE | ORAL | Status: AC
  Administered 2015-07-04: 20 mg via ORAL
  Filled 2015-07-04: qty 1

## 2015-07-04 NOTE — ED Notes (Signed)
BEHAVIORAL HEALTH ROUNDING Patient sleeping: No. Patient alert and oriented: Alert but only oriented to self Behavior appropriate: Pt tearful, then blaming others for stealing her clothes and telling her "to shut up and go to bed".   Nutrition and fluids offered: Yes  Toileting and hygiene offered: Yes  Sitter present: q15 min observations  Law enforcement present: Yes [employer]

## 2015-07-04 NOTE — ED Notes (Signed)
Pt presents to ED with severe agitation that has worsened this evening; hx of dementia. Recently removed from her home in [place] and has been living with her daughter for the past 2 weeks.  Seen in this ED last night and treated for UTI. Pt yelling while at home and attempting to leave her daughters home and purposely voided on carpet. Pt daughter states she "can not handle her" and that she "needs medication to keep her sedated".  Pt anxious during triage.

## 2015-07-04 NOTE — ED Notes (Signed)

## 2015-07-04 NOTE — ED Provider Notes (Signed)
[facility] Emergency Department Provider Note  Time seen: 9:46 PM  I have reviewed the triage vital signs and the nursing notes.   HISTORY  Chief Complaint Psychiatric Evaluation    HPI Ruth Juarez is a 81 y.o. female with a past medical history of dementia who presents to the emergency department with worsening dementia. According to report the daughter states the patient has been living with her for approximately one week, she has now extremely difficult to take care of. Not listening to her, urinated purposefully on the floor today. The daughter states she cannot adequately care for the patient any longer at home so she brought her to the emergency department. Patient was also seen in the emergency department several days ago, diagnosed with a urinary tract infection. Patient is unable to give an adequate history due to her confusion likely due to dementia.     History reviewed. No pertinent past medical history.  Patient Active Problem List   Diagnosis Date Noted  . Alzheimer's dementia with behavioral disturbance 07/03/2015  . Urinary tract infection 07/03/2015    Past Surgical History  Procedure Laterality Date  . Abdominal hysterectomy    . Tonsillectomy      Current Outpatient Rx  Name  Route  Sig  Dispense  Refill  . cephALEXin (KEFLEX) 500 MG capsule   Oral   Take 1 capsule (500 mg total) by mouth 2 (two) times daily.   14 capsule   0   . risperiDONE (RISPERDAL) 0.5 MG tablet   Oral   Take 1 tablet (0.5 mg total) by mouth 2 (two) times daily as needed (agitation).   60 tablet   0     Allergies Review of patient's allergies indicates no known allergies.  No family history on file.  Social History History  Substance Use Topics  . Smoking status: Never Smoker   . Smokeless tobacco: Not on file  . Alcohol Use: No     Comment: Patient denies     Review of Systems Constitutional: Negative for fever. Cardiovascular:  Negative for chest pain. Respiratory: Negative for shortness of breath. Gastrointestinal: Negative for abdominal pain Musculoskeletal: Negative for back pain. Denies any pain symptoms, but review of systems is largely limited by patient's underlying dementia. 10-point ROS otherwise negative.  ____________________________________________   PHYSICAL EXAM:  VITAL SIGNS: ED Triage Vitals  Enc Vitals Group     BP 07/04/15 1920 178/81 mmHg     Pulse Rate 07/04/15 1920 126     Resp --      Temp 07/04/15 1920 98.3 F (36.8 C)     Temp Source 07/04/15 1920 Oral     SpO2 07/04/15 1920 97 %     Weight 07/04/15 1920 135 lb (61.236 kg)     Height 07/04/15 1920  (1.6 m)     Head Cir --      Peak Flow --      Pain Score --      Pain Loc --      Pain Edu? --      Excl. in GC? --     Constitutional: Alert, disoriented to time and place.  Eyes: Normal exam ENT   Head: Normocephalic and atraumatic. Cardiovascular: Normal rate, regular rhythm. No murmur Respiratory: Normal respiratory effort without tachypnea nor retractions. Breath sounds are clear  Gastrointestinal: Soft and nontender. No distention.   Musculoskeletal: Nontender with normal range of motion in all extremities. No lower extremity tenderness or edema.  Neurologic:  Normal speech and language. No gross focal neurologic deficits  Skin:  Skin is warm, dry and intact.  Psychiatric: Mood and affect are normal. Speech and behavior are normal.  ____________________________________________     INITIAL IMPRESSION / ASSESSMENT AND PLAN / ED COURSE  Pertinent labs & imaging results that were available during my care of the patient were reviewed by me and considered in my medical decision making (see chart for details).  Patient with worsening dementia, daughter can no longer adequately care for the patient at home. We will recheck labs. I placed a social work consult for the patient to help arrange long-term care at an  appropriate facility.    ____________________________________________   FINAL CLINICAL IMPRESSION(S) / ED DIAGNOSES  Dementia   Minna AntisKevin Sae Handrich, MD 07/04/15 2159

## 2015-07-05 ENCOUNTER — Emergency Department: Payer: Medicare Other

## 2015-07-05 DIAGNOSIS — G308 Other Alzheimer's disease: Secondary | ICD-10-CM

## 2015-07-05 DIAGNOSIS — F0390 Unspecified dementia without behavioral disturbance: Secondary | ICD-10-CM | POA: Diagnosis not present

## 2015-07-05 MED ORDER — RISPERIDONE 1 MG PO TABS
0.5000 mg | ORAL_TABLET | ORAL | Status: DC
  Administered 2015-07-06: 0.5 mg via ORAL
  Filled 2015-07-05: qty 1

## 2015-07-05 MED ORDER — HYDROXYZINE HCL 25 MG PO TABS
50.0000 mg | ORAL_TABLET | Freq: Once | ORAL | Status: AC
  Administered 2015-07-05: 50 mg via ORAL
  Filled 2015-07-05: qty 2

## 2015-07-05 MED ORDER — FAMOTIDINE 20 MG PO TABS
40.0000 mg | ORAL_TABLET | Freq: Once | ORAL | Status: AC
  Administered 2015-07-05: 40 mg via ORAL
  Filled 2015-07-05: qty 2

## 2015-07-05 MED ORDER — RISPERIDONE 1 MG PO TABS
1.0000 mg | ORAL_TABLET | Freq: Every day | ORAL | Status: DC
Start: 2015-07-05 — End: 2015-07-07
  Administered 2015-07-05 – 2015-07-06 (×2): 1 mg via ORAL
  Filled 2015-07-05 (×2): qty 1

## 2015-07-05 MED ORDER — CEPHALEXIN 500 MG PO CAPS
500.0000 mg | ORAL_CAPSULE | Freq: Two times a day (BID) | ORAL | Status: DC
  Administered 2015-07-05 – 2015-07-07 (×5): 500 mg via ORAL
  Filled 2015-07-05 (×5): qty 1

## 2015-07-05 NOTE — ED Notes (Addendum)
BEHAVIORAL HEALTH ROUNDING Patient sleeping: No. Patient alert and oriented: yes Behavior appropriate: Yes.  ;  Nutrition and fluids offered: Yes  Toileting and hygiene offered: Yes  Sitter present: yes Law enforcement present: Yes  

## 2015-07-05 NOTE — Progress Notes (Signed)
Notified by [facility] OT Loy that Ms. Caffee had been sent to the ED. Of note she was admitted to [facility] services for OT, Nursing and Social Work yesterday 7/19. Writer was unable to interact with patient due to her location in the ED.  Writer spoke with both CMRN Elnita MaxwellCheryl and CSW Olivia Mackieressa regarding discharge plans and also to notify them that Ms. Petitti was receiving [facility] services at her daughter's home. Per conversation with Olivia Mackieressa current plan is for discharge back to home after she is seen by Psychiatry. [facility] team updated. Writer to fax discharge information to team when available. Thank you. Dayna BarkerKaren Robertson RN, BSN, [facility], [facility] [phone] c

## 2015-07-05 NOTE — ED Notes (Signed)
ED BHU PLACEMENT JUSTIFICATION Is the patient under IVC or is there intent for IVC: No. Is the patient medically cleared: Yes.   Is there vacancy in the ED BHU: Yes.   Is the population mix appropriate for patient: No. Is the patient awaiting placement in inpatient or outpatient setting: Yes.   Has the patient had a psychiatric consult: Yes.   Survey of unit performed for contraband, proper placement and condition of furniture, tampering with fixtures in bathroom, shower, and each patient room: Yes.  ; Findings:  APPEARANCE/BEHAVIOR calm, cooperative and adequate rapport can be established NEURO ASSESSMENT Orientation: person Hallucinations: No.None noted (Hallucinations) Speech: Normal Gait: normal RESPIRATORY ASSESSMENT Normal expansion.  Clear to auscultation.  No rales, rhonchi, or wheezing., No chest wall tenderness., No kyphosis or scoliosis. CARDIOVASCULAR ASSESSMENT regular rate and rhythm, S1, S2 normal, no murmur, click, rub or gallop GASTROINTESTINAL ASSESSMENT soft, nontender, BS WNL, no r/g EXTREMITIES normal strength, tone, and muscle mass, no deformities, no erythema, induration, or nodules, no evidence of joint effusion, ROM of all joints is normal, no evidence of joint instability PLAN OF CARE Provide calm/safe environment. Vital signs assessed twice daily. ED BHU Assessment once each 12-hour shift. Collaborate with intake RN daily or as condition indicates. Assure the ED provider has rounded once each shift. Provide and encourage hygiene. Provide redirection as needed. Assess for escalating behavior; address immediately and inform ED provider.  Assess family dynamic and appropriateness for visitation as needed: No.; If necessary, describe findings: Family not present, family dynamics can not assess.  Educate the patient/family about BHU procedures/visitation: Yes.  ; If necessary, describe findings: Pt unable to understand

## 2015-07-05 NOTE — ED Notes (Signed)
BEHAVIORAL HEALTH ROUNDING Patient sleeping: No. Patient alert and oriented: yes Behavior appropriate: Yes.  ; If no, describe:  Nutrition and fluids offered: Yes  Toileting and hygiene offered: Yes  Sitter present: no Law enforcement present: Yes  

## 2015-07-05 NOTE — ED Notes (Signed)
BEHAVIORAL HEALTH ROUNDING Patient sleeping: Yes.   Patient alert and oriented: not applicable Behavior appropriate: Yes.  ; If no, describe:  Nutrition and fluids offered: Yes  Toileting and hygiene offered: Yes  Sitter present: no Law enforcement present: Yes  

## 2015-07-05 NOTE — ED Notes (Signed)
Patient assigned to appropriate care area. Patient oriented to unit/care area: Informed that, for their safety, care areas are designed for safety and monitored by security cameras at all times; and visiting hours explained to patient. Patient verbalizes understanding, and verbal contract for safety obtained. 

## 2015-07-05 NOTE — ED Notes (Addendum)

## 2015-07-05 NOTE — ED Notes (Signed)
BEHAVIORAL HEALTH ROUNDING Patient sleeping: No. Patient alert and oriented: yes Behavior appropriate: Yes.  ; If no, describe: Oriented to self. Nutrition and fluids offered: Yes  Toileting and hygiene offered: Yes  Sitter present: no Law enforcement present: Yes

## 2015-07-05 NOTE — ED Notes (Signed)
BEHAVIORAL HEALTH ROUNDING Patient sleeping: No. Patient alert and oriented: yes Behavior appropriate: Yes.  ; If no, describe: Oriented self. Nutrition and fluids offered: Yes  Toileting and hygiene offered: Yes  Sitter present: no Law enforcement present: Yes

## 2015-07-05 NOTE — ED Notes (Signed)
BEHAVIORAL HEALTH ROUNDING Patient sleeping: Yes.   Patient alert and oriented: not applicable Behavior appropriate: Yes.    Nutrition and fluids offered: No Toileting and hygiene offered: No Sitter present: q15 minute observations Law enforcement present: Yes [employer] 

## 2015-07-05 NOTE — ED Notes (Signed)
BEHAVIORAL HEALTH ROUNDING Patient sleeping: No. Patient alert and oriented: no Behavior appropriate: Yes.  ; If no, describe:  Nutrition and fluids offered: Yes  Toileting and hygiene offered: Yes  Sitter present: yes Law enforcement present: Yes  

## 2015-07-05 NOTE — Consult Note (Signed)
[facility] Face-to-Face Psychiatry Consult   Reason for Consult:  Consult for this 81 year old woman brought to the emergency room by her daughter Referring Physician:  Joni Fears Patient Identification: Ruth Juarez MRN:  [id number] Principal Diagnosis: Alzheimer's dementia with behavioral disturbance Diagnosis:   Patient Active Problem List   Diagnosis Date Noted  . Alzheimer's dementia with behavioral disturbance [G30.8] 07/03/2015  . Urinary tract infection [N39.0] 07/03/2015    Total Time spent with patient: 45 minutes  Subjective:   Ruth Juarez is a 81 y.o. female patient admitted with "I really don't know".  HPI:  Information from the patient from the chart and from the patient's daughter. This 81 year old woman with a history of dementia was here in the emergency room just a couple days ago. Her daughter had brought her down from [place] with the plan that the patient would be living with her. When she got down here in [place] the daughter discovered that the patient tends to wander and can become labile and belligerent especially in the evening. It did not appear to be safe to manage her at home. When we saw her a couple days ago she still had a urinary tract infection and was given Vicente Males biotics and was also started on Risperdal half milligram twice a day. Since then the patient is continued to have behavior problems. She wonders away from the home in the evening. Gets confused. He gets argumentative agitated and possibly even aggressive when people try and get in her way. The patient herself denies any mood symptoms. Denies suicidal or homicidal ideation. Denies hallucinations. She is confused and disoriented. No idea where she is. Her understanding of what's going on changes from minute to minute.  The patient clearly has dementia which is probably Alzheimer's primarily possibly with vascular component. Last time she was here she had a urinary tract infection that could've  been contributing. Her urine analysis today he is clean suggesting that that is no longer an active issue. Daughter reports no previous diagnosis of any psychiatric problems although she says that her mother was "always a mean woman".  Social history: Patient had been living by herself in [place]. Her home was filthy and in [place] and apparently is so bad that they have condemned it on her going to tear it down. Daughter here in [place] is trying to take responsibility.  Family history: Nonidentified  Substance abuse history: No alcohol or drug abuse.  Medical history: No known significant ongoing medical problems. Still on some Keflex for urinary tract infection HPI Elements:   Quality:  Confusion with agitation. Severity:  Severe and potentially dangerous to herself with her behavior. Timing:  Happens mostly at night although she gets confused in the day as well. Duration:  Ongoing probably been present for years but worse since coming down here to [place] in a new environment. Context:  Recent change of residence..  Past Medical History: History reviewed. No pertinent past medical history.  Past Surgical History  Procedure Laterality Date  . Abdominal hysterectomy    . Tonsillectomy     Family History: No family history on file. Social History:  History  Alcohol Use No    Comment: Patient denies      History  Drug Use No    Comment: Patient denies    History   Social History  . Marital Status: Widowed    Spouse Name: N/A  . Number of Children: N/A  . Years of Education: N/A  Social History Main Topics  . Smoking status: Never Smoker   . Smokeless tobacco: Not on file  . Alcohol Use: No     Comment: Patient denies   . Drug Use: No     Comment: Patient denies  . Sexual Activity: No   Other Topics Concern  . None   Social History Narrative   Additional Social History:                          Allergies:  No Known  Allergies  Labs:  Results for orders placed or performed during the hospital encounter of 07/04/15 (from the past 48 hour(s))  Comprehensive metabolic panel     Status: Abnormal   Collection Time: 07/04/15  7:52 PM  Result Value Ref Range   Sodium 141 135 - 145 mmol/L   Potassium 4.4 3.5 - 5.1 mmol/L   Chloride 107 101 - 111 mmol/L   CO2 26 22 - 32 mmol/L   Glucose, Bld 149 (H) 65 - 99 mg/dL   BUN 22 (H) 6 - 20 mg/dL   Creatinine, Ser 1.12 (H) 0.44 - 1.00 mg/dL   Calcium 9.5 8.9 - 10.3 mg/dL   Total Protein 7.3 6.5 - 8.1 g/dL   Albumin 3.6 3.5 - 5.0 g/dL   AST 24 15 - 41 U/L   ALT 14 14 - 54 U/L   Alkaline Phosphatase 71 38 - 126 U/L   Total Bilirubin 0.2 (L) 0.3 - 1.2 mg/dL   GFR calc non Af Amer 44 (L) >60 mL/min   GFR calc Af Amer 50 (L) >60 mL/min    Comment: (NOTE) The eGFR has been calculated using the CKD EPI equation. This calculation has not been validated in all clinical situations. eGFR's persistently <60 mL/min signify possible Chronic Kidney Disease.    Anion gap 8 5 - 15  Ethanol     Status: None   Collection Time: 07/04/15  7:52 PM  Result Value Ref Range   Alcohol, Ethyl (B) <5 <5 mg/dL    Comment:        LOWEST DETECTABLE LIMIT FOR SERUM ALCOHOL IS 5 mg/dL FOR MEDICAL PURPOSES ONLY   CBC with Differential     Status: None   Collection Time: 07/04/15  7:52 PM  Result Value Ref Range   WBC 8.8 3.6 - 11.0 K/uL   RBC 4.83 3.80 - 5.20 MIL/uL   Hemoglobin 13.5 12.0 - 16.0 g/dL   HCT 41.1 35.0 - 47.0 %   MCV 85.1 80.0 - 100.0 fL   MCH 27.9 26.0 - 34.0 pg   MCHC 32.8 32.0 - 36.0 g/dL   RDW 14.3 11.5 - 14.5 %   Platelets 434 150 - 440 K/uL   Neutrophils Relative % 72 %   Neutro Abs 6.3 1.4 - 6.5 K/uL   Lymphocytes Relative 17 %   Lymphs Abs 1.5 1.0 - 3.6 K/uL   Monocytes Relative 9 %   Monocytes Absolute 0.8 0.2 - 0.9 K/uL   Eosinophils Relative 1 %   Eosinophils Absolute 0.1 0 - 0.7 K/uL   Basophils Relative 1 %   Basophils Absolute 0.1 0 - 0.1  K/uL  Urinalysis complete, with microscopic ([facility] only)     Status: Abnormal   Collection Time: 07/04/15  9:31 PM  Result Value Ref Range   Color, Urine YELLOW (A) YELLOW   APPearance CLEAR (A) CLEAR   Glucose, UA NEGATIVE NEGATIVE mg/dL   Bilirubin  Urine NEGATIVE NEGATIVE   Ketones, ur NEGATIVE NEGATIVE mg/dL   Specific Gravity, Urine 1.016 1.005 - 1.030   Hgb urine dipstick NEGATIVE NEGATIVE   pH 6.0 5.0 - 8.0   Protein, ur NEGATIVE NEGATIVE mg/dL   Nitrite NEGATIVE NEGATIVE   Leukocytes, UA NEGATIVE NEGATIVE   RBC / HPF 0-5 0 - 5 RBC/hpf   WBC, UA 0-5 0 - 5 WBC/hpf   Bacteria, UA RARE (A) NONE SEEN   Squamous Epithelial / LPF 0-5 (A) NONE SEEN   Mucous PRESENT    Hyaline Casts, UA PRESENT    Ca Oxalate Crys, UA PRESENT   Urine Drug Screen, Qualitative     Status: Abnormal   Collection Time: 07/04/15  9:31 PM  Result Value Ref Range   Tricyclic, Ur Screen NONE DETECTED NONE DETECTED   Amphetamines, Ur Screen NONE DETECTED NONE DETECTED   MDMA (Ecstasy)Ur Screen NONE DETECTED NONE DETECTED   Cocaine Metabolite,Ur [place] NONE DETECTED NONE DETECTED   Opiate, Ur Screen NONE DETECTED NONE DETECTED   Phencyclidine (PCP) Ur S NONE DETECTED NONE DETECTED   Cannabinoid 50 Ng, Ur [place] NONE DETECTED NONE DETECTED   Barbiturates, Ur Screen NONE DETECTED NONE DETECTED   Benzodiazepine, Ur Scrn POSITIVE (A) NONE DETECTED   Methadone Scn, Ur NONE DETECTED NONE DETECTED    Comment: (NOTE) 657  Tricyclics, urine               Cutoff 1000 ng/mL 200  Amphetamines, urine             Cutoff 1000 ng/mL 300  MDMA (Ecstasy), urine           Cutoff 500 ng/mL 400  Cocaine Metabolite, urine       Cutoff 300 ng/mL 500  Opiate, urine                   Cutoff 300 ng/mL 600  Phencyclidine (PCP), urine      Cutoff 25 ng/mL 700  Cannabinoid, urine              Cutoff 50 ng/mL 800  Barbiturates, urine             Cutoff 200 ng/mL 900  Benzodiazepine, urine           Cutoff 200 ng/mL 1000 Methadone, urine                 Cutoff 300 ng/mL 1100 1200 The urine drug screen provides only a preliminary, unconfirmed 1300 analytical test result and should not be used for non-medical 1400 purposes. Clinical consideration and professional judgment should 1500 be applied to any positive drug screen result due to possible 1600 interfering substances. A more specific alternate chemical method 1700 must be used in order to obtain a confirmed analytical result.  1800 Gas chromato graphy / mass spectrometry (GC/MS) is the preferred 1900 confirmatory method.     Vitals: Blood pressure 147/97, pulse 79, temperature 97.7 F (36.5 C), temperature source Oral, resp. rate 18, height 5' 3"  (1.6 m), weight 61.236 kg (135 lb), SpO2 100 %.  Risk to Self: Is patient at risk for suicide?: No Risk to Others:   Prior Inpatient Therapy:   Prior Outpatient Therapy:    Current Facility-Administered Medications  Medication Dose Route Frequency Provider Last Rate Last Dose  . cephALEXin (KEFLEX) capsule 500 mg  500 mg Oral Q12H Gonzella Lex, MD      . Derrill Memo ON 07/06/2015] risperiDONE (RISPERDAL) tablet 0.5 mg  0.5 mg Oral BH-q7a Gonzella Lex, MD      . risperiDONE (RISPERDAL) tablet 1 mg  1 mg Oral QHS Gonzella Lex, MD       Current Outpatient Prescriptions  Medication Sig Dispense Refill  . cephALEXin (KEFLEX) 500 MG capsule Take 1 capsule (500 mg total) by mouth 2 (two) times daily. (Patient taking differently: Take 500 mg by mouth 2 (two) times daily. For 7 days) 14 capsule 0  . mirtazapine (REMERON) 7.5 MG tablet Take 7.5 mg by mouth at bedtime.    . risperiDONE (RISPERDAL) 0.5 MG tablet Take 1 tablet (0.5 mg total) by mouth 2 (two) times daily as needed (agitation). 60 tablet 0    Musculoskeletal: Strength & Muscle Tone: within normal limits Gait & Station: shuffle Patient leans: N/A  Psychiatric Specialty Exam: Physical Exam  Constitutional: She appears well-developed and well-nourished.  HENT:  Head:  Normocephalic and atraumatic.  Eyes: Conjunctivae are normal. Pupils are equal, round, and reactive to light.  Neck: Normal range of motion.  Cardiovascular: Normal heart sounds.   Respiratory: Effort normal.  GI: Soft.  Musculoskeletal: Normal range of motion.  Neurological: She is alert.  Skin: Skin is warm and dry.  Psychiatric: She has a normal mood and affect. Her speech is delayed. She is slowed. Thought content is delusional. Cognition and memory are impaired. She expresses inappropriate judgment. She exhibits abnormal recent memory and abnormal remote memory.  Patient is pleasantly demented. She is awake and cooperative with the interview. She is disoriented completely. Not hostile not threatening but does tend to wander because of her confusion.    Review of Systems  Constitutional: Negative.   HENT: Negative.   Eyes: Negative.   Respiratory: Negative.   Cardiovascular: Negative.   Gastrointestinal: Negative.   Musculoskeletal: Negative.   Skin: Negative.   Neurological: Negative.   Psychiatric/Behavioral: Positive for memory loss. Negative for depression, suicidal ideas, hallucinations and substance abuse. The patient is not nervous/anxious and does not have insomnia.     Blood pressure 147/97, pulse 79, temperature 97.7 F (36.5 C), temperature source Oral, resp. rate 18, height 5' 3"  (1.6 m), weight 61.236 kg (135 lb), SpO2 100 %.Body mass index is 23.92 kg/(m^2).  General Appearance: Disheveled  Eye Contact::  Good  Speech:  Slow  Volume:  Decreased  Mood:  Euthymic  Affect:  Confused although she is trying to put on a good front. Has not been displaying hostility that I have seen  Thought Process:  Disorganized and Loose  Orientation:  Negative  Thought Content:  Delusions  Suicidal Thoughts:  No  Homicidal Thoughts:  No  Memory:  Immediate;   Fair Recent;   Poor Remote;   Fair  Judgement:  Impaired  Insight:  Lacking  Psychomotor Activity:  Decreased   Concentration:  Poor  Recall:  Poor  Fund of Knowledge:Poor  Language: Poor  Akathisia:  No  Handed:  Right  AIMS (if indicated):     Assets:  Physical Health Social Support  ADL's:  Intact  Cognition: Impaired,  Moderate  Sleep:      Medical Decision Making: Review of Psycho-Social Stressors (1), Review or order clinical lab tests (1), Established Problem, Worsening (2), Review or order medicine tests (1), Review of Medication Regimen & Side Effects (2) and Review of New Medication or Change in Dosage (2)  Treatment Plan Summary: Medication management and Plan It is clear that this patient's behaviors not manageable at her daughter's home. Daughter brought  her in with the request that we somehow do some sort of treatment that would make the patient easy to manage at home. I think that she doesn't quite understand how difficult this might be. Clearly there is nothing were going to change here in the emergency room that is going to change the current problem. Really our only option is to try sending her to a geriatric psychiatry ward to see if they can make any sort of adjustment that will assist with the patient being manageable at home. Meanwhile I have increased the Risperdal to half milligram and a full milligram at night social work is aware and is assisting. We hope we can get her referred to a geriatric psychiatry unit. Continue the Vicente Males biotic for 5 more days  Plan:  Recommend psychiatric Inpatient admission when medically cleared. Supportive therapy provided about ongoing stressors. Disposition: Refer to geriatric psychiatry unit as noted above  Alethia Berthold 07/05/2015 2:12 PM

## 2015-07-05 NOTE — ED Notes (Signed)
Pt would not move for v/s nor breakfast

## 2015-07-05 NOTE — ED Notes (Signed)
Patient c/o burning sensation in mid-chest, states she feels like it's heartburn. Dr. Pershing ProudSchaevitz aware.

## 2015-07-05 NOTE — ED Provider Notes (Signed)
-----------------------------------------   6:15 AM on 07/05/2015 -----------------------------------------  Patient had no acute events overnight. She is resting comfortably in no acute distress. Clinical social work consult pending this morning.  Irean HongJade J Druscilla Petsch, MD 07/05/15 [phone]

## 2015-07-05 NOTE — ED Notes (Signed)
BEHAVIORAL HEALTH ROUNDING Patient sleeping: Yes.   Patient alert and oriented: not applicable Behavior appropriate: Yes.  ; If no, describe:  Nutrition and fluids offered: Yes  Toileting and hygiene offered: Yes  Sitter present: q15 minute observation, [place] [facility]. Law enforcement present: Yes

## 2015-07-05 NOTE — ED Notes (Signed)
VOL/Social work placement/Psych consult completed

## 2015-07-05 NOTE — Progress Notes (Signed)
CSW spoke with the patient's daughter/primary caregiver to gather information on the family's needs at this time.  She reports that her mother has become more agitated and aggressive in the evening.  She reports last night her mother was making attempts to leave the home suggesting she was going to home with her dogs.  The daughter reports during the night needing to stand at the doors to stop her mother from leaving.    The daughter reports at this time the family is not wanting to seek nursing home/memory care placement for the patient.  Per the daughter the mother once medically cleared can returned home in her care.  The patient is now connected to [facility] for home health services and other resources.  CSW attempted to contact the dementia specialist at [facility] but only able to leave a message to return a call to the patient's daughter for support.    CSW informed Dr. Scotty CourtStafford, nursing staff, [facility], and Dr. Toni Amendlapacs the family is not seeking long-term nursing home placement suggested in the social work consult.  Dr. Toni Amendlapacs will complete an evaluation for recommendations.    Maryelizabeth Rowanressa Elysse Polidore, MSW, LCSW, LCAS Clinical Social Worker [phone]

## 2015-07-05 NOTE — Progress Notes (Signed)
Dr. Toni Amendlapacs has recommended inpatient geri-psych for treatment at this time. [facility] and spoke with [facility] who has available beds and CSW will fax referral.    CSW spoke with Dora SimsSonjia, RN to have a chest x-ray ordered for placement.     Maryelizabeth Rowanressa Lebert Lovern, MSW, LCSW, LCAS Clinical Social Worker [phone]

## 2015-07-05 NOTE — ED Notes (Signed)

## 2015-07-05 NOTE — ED Notes (Signed)
ED BHU PLACEMENT JUSTIFICATION Is the patient under IVC or is there intent for IVC: Yes.   Is the patient medically cleared: Yes.   Is there vacancy in the ED BHU: Yes.   Is the population mix appropriate for patient: No. Is the patient awaiting placement in inpatient or outpatient setting: Yes.   Has the patient had a psychiatric consult: Yes.   Survey of unit performed for contraband, proper placement and condition of furniture, tampering with fixtures in bathroom, shower, and each patient room: Yes.  ; Findings:  APPEARANCE/BEHAVIOR calm and cooperative NEURO ASSESSMENT Orientation: person Hallucinations: No.None noted (Hallucinations) Speech: Normal Gait: normal RESPIRATORY ASSESSMENT Normal expansion.  Clear to auscultation.  No rales, rhonchi, or wheezing. CARDIOVASCULAR ASSESSMENT regular rate and rhythm, S1, S2 normal, no murmur, click, rub or gallop GASTROINTESTINAL ASSESSMENT soft, nontender, BS WNL, no r/g EXTREMITIES normal strength, tone, and muscle mass PLAN OF CARE Provide calm/safe environment. Vital signs assessed twice daily. ED BHU Assessment once each 12-hour shift. Collaborate with intake RN daily or as condition indicates. Assure the ED provider has rounded once each shift. Provide and encourage hygiene. Provide redirection as needed. Assess for escalating behavior; address immediately and inform ED provider.  Assess family dynamic and appropriateness for visitation as needed: Yes.  ; If necessary, describe findings:  Educate the patient/family about BHU procedures/visitation: Yes.  ; If necessary, describe findings:

## 2015-07-06 DIAGNOSIS — F0390 Unspecified dementia without behavioral disturbance: Secondary | ICD-10-CM | POA: Diagnosis not present

## 2015-07-06 MED ORDER — RISPERIDONE 1 MG PO TABS
1.0000 mg | ORAL_TABLET | ORAL | Status: DC
  Administered 2015-07-07: 1 mg via ORAL
  Filled 2015-07-06: qty 1

## 2015-07-06 MED ORDER — HALOPERIDOL LACTATE 5 MG/ML IJ SOLN
5.0000 mg | Freq: Once | INTRAMUSCULAR | Status: AC
  Administered 2015-07-06: 5 mg via INTRAMUSCULAR
  Filled 2015-07-06: qty 1

## 2015-07-06 MED ORDER — LORAZEPAM 2 MG/ML IJ SOLN
INTRAMUSCULAR | Status: AC
  Filled 2015-07-06: qty 1

## 2015-07-06 MED ORDER — LORAZEPAM 2 MG/ML IJ SOLN
1.0000 mg | Freq: Once | INTRAMUSCULAR | Status: AC
  Administered 2015-07-06: 1 mg via INTRAMUSCULAR

## 2015-07-06 MED ORDER — LORAZEPAM 1 MG PO TABS
ORAL_TABLET | ORAL | Status: AC
  Filled 2015-07-06: qty 1

## 2015-07-06 NOTE — ED Notes (Signed)
BEHAVIORAL HEALTH ROUNDING Patient sleeping: Yes.   Patient alert and oriented: sleeping Behavior appropriate: Yes.  ; If no, describe: sleeping Nutrition and fluids offered: No sleeping Toileting and hygiene offered: No sleeping Sitter present: yes Law enforcement present: Yes

## 2015-07-06 NOTE — Progress Notes (Signed)
Attempted to secure placement:  [facility] denied per doctor patient need a long-term placement. [facility]- faxed information [facility]- faxed information [facility]- faxed information [facility]- faxed information [facility]- Dementia is an exclusionary diagnosis [facility]- Dementia is an exclusionary diagnosis [facility]- faxed information [facility]- faxed information [facility]- faxed information [facility]- faxed information [facility]- faxed information [facility]- faxed information   Maryelizabeth Rowan, MSW, LCSW, LCAS Clinical Social Worker [phone]

## 2015-07-06 NOTE — ED Notes (Signed)
BEHAVIORAL HEALTH ROUNDING Patient sleeping: Yes.   Patient alert and oriented: Pt is sleeping.  Behavior appropriate: Pt is sleeping Nutrition and fluids offered: Pt is sleeping.  Toileting and hygiene offered: Pt is sleeping.  Sitter present: yes Law enforcement present: Yes  

## 2015-07-06 NOTE — ED Notes (Addendum)
ED BHU PLACEMENT JUSTIFICATION Is the patient under IVC or is there intent for IVC: Yes.   Is the patient medically cleared: Yes.   Is there vacancy in the ED BHU: No. Is the population mix appropriate for patient: No. Is the patient awaiting placement in inpatient or outpatient setting: Yes.   Has the patient had a psychiatric consult: Yes.   Survey of unit performed for contraband, proper placement and condition of furniture, tampering with fixtures in bathroom, shower, and each patient room: Yes.  ; Findings:  APPEARANCE/BEHAVIOR Calm,sleeping NEURO ASSESSMENT Orientation: sleeping Hallucinations: No.None noted (Hallucinations) Speech: sleeping Gait: sleeping RESPIRATORY ASSESSMENT Normal expansion.  Clear to auscultation.  No rales, rhonchi, or wheezing. CARDIOVASCULAR ASSESSMENT regular rate and rhythm, S1, S2 normal, no murmur, click, rub or gallop GASTROINTESTINAL ASSESSMENT soft, nontender, BS WNL, no r/g EXTREMITIES normal strength, tone, and muscle mass PLAN OF CARE Provide calm/safe environment. Vital signs assessed twice daily. ED BHU Assessment once each 12-hour shift. Collaborate with intake RN daily or as condition indicates. Assure the ED provider has rounded once each shift. Provide and encourage hygiene. Provide redirection as needed. Assess for escalating behavior; address immediately and inform ED provider.  Assess family dynamic and appropriateness for visitation as needed: Yes.  ; If necessary, describe findings:  Educate the patient/family about BHU procedures/visitation: Yes.  ; If necessary, describe findings:    ENVIRONMENTAL ASSESSMENT Potentially harmful objects out of patient reach: Yes.   Personal belongings secured: Yes.   Patient dressed in hospital provided attire only: Yes.   Plastic bags out of patient reach: Yes.   Patient care equipment (cords, cables, call bells, lines, and drains) shortened, removed, or accounted for: Yes.   Equipment and  supplies removed from bottom of stretcher: Yes.   Potentially toxic materials out of patient reach: Yes.   Sharps container removed or out of patient reach: Yes.    BEHAVIORAL HEALTH ROUNDING Patient sleeping: Yes.   Patient alert and oriented: not applicable Behavior appropriate: Yes.  ; If no, describe:  Nutrition and fluids offered: sleeping Toileting and hygiene offered: sleeping Sitter present: no Law enforcement present: Yes

## 2015-07-06 NOTE — ED Notes (Signed)
Pt restless, pt continues to try and leave room, stating that she needs to go "feed my dogs". Pt is attempting to sit on other pts beds.  Multiple attempts at distracting pt have been unsuccessful.  Pt has become aggressive and demanding to leave.

## 2015-07-06 NOTE — Consult Note (Signed)
[facility] Face-to-Face Psychiatry Consult   Reason for Consult:  Consult for this 81 year old woman brought to the emergency room by her daughter Referring Physician:  Joni Fears Patient Identification: Ruth Juarez MRN:  [id number] Principal Diagnosis: Alzheimer's dementia with behavioral disturbance Diagnosis:   Patient Active Problem List   Diagnosis Date Noted  . Alzheimer's dementia with behavioral disturbance [G30.8] 07/03/2015  . Urinary tract infection [N39.0] 07/03/2015    Total Time spent with patient: 45 minutes  Subjective:   Ruth Juarez is a 81 y.o. female patient admitted with "I really don't know".  HPI:  Information from the patient from the chart and from the patient's daughter. This 41 year old woman with a history of dementia was here in the emergency room just a couple days ago. Her daughter had brought her down from [place] with the plan that the patient would be living with her. When she got down here in [place] the daughter discovered that the patient tends to wander and can become labile and belligerent especially in the evening. It did not appear to be safe to manage her at home. When we saw her a couple days ago she still had a urinary tract infection and was given Vicente Males biotics and was also started on Risperdal half milligram twice a day. Since then the patient is continued to have behavior problems. She wonders away from the home in the evening. Gets confused. He gets argumentative agitated and possibly even aggressive when people try and get in her way. The patient herself denies any mood symptoms. Denies suicidal or homicidal ideation. Denies hallucinations. She is confused and disoriented. No idea where she is. Her understanding of what's going on changes from minute to minute.  The patient clearly has dementia which is probably Alzheimer's primarily possibly with vascular component. Last time she was here she had a urinary tract infection that could've  been contributing. Her urine analysis today he is clean suggesting that that is no longer an active issue. Daughter reports no previous diagnosis of any psychiatric problems although she says that her mother was "always a mean woman".  Social history: Patient had been living by herself in [place]. Her home was filthy and in [place] and apparently is so bad that they have condemned it on her going to tear it down. Daughter here in [place] is trying to take responsibility.  Family history: Nonidentified  Substance abuse history: No alcohol or drug abuse.  Medical history: No known significant ongoing medical problems. Still on some Keflex for urinary tract infection  Update as of Thursday the 21st. Patient continues to have a little bit of intermittent aggression and agitation but has not been violent and is generally redirectable. Affect is usually smiling when she is approached. Remains confused with very poor cognition. No signs of any side effects to medicine. We have not had any response to attempts to place her in a geriatric psychiatry unit HPI Elements:   Quality:  Confusion with agitation. Severity:  Severe and potentially dangerous to herself with her behavior. Timing:  Happens mostly at night although she gets confused in the day as well. Duration:  Ongoing probably been present for years but worse since coming down here to [place] in a new environment. Context:  Recent change of residence..  Past Medical History: History reviewed. No pertinent past medical history.  Past Surgical History  Procedure Laterality Date  . Abdominal hysterectomy    . Tonsillectomy     Family History: No  family history on file. Social History:  History  Alcohol Use No    Comment: Patient denies      History  Drug Use No    Comment: Patient denies    History   Social History  . Marital Status: Widowed    Spouse Name: N/A  . Number of Children: N/A  . Years of  Education: N/A   Social History Main Topics  . Smoking status: Never Smoker   . Smokeless tobacco: Not on file  . Alcohol Use: No     Comment: Patient denies   . Drug Use: No     Comment: Patient denies  . Sexual Activity: No   Other Topics Concern  . None   Social History Narrative   Additional Social History:                          Allergies:  No Known Allergies  Labs:  Results for orders placed or performed during the hospital encounter of 07/04/15 (from the past 48 hour(s))  Comprehensive metabolic panel     Status: Abnormal   Collection Time: 07/04/15  7:52 PM  Result Value Ref Range   Sodium 141 135 - 145 mmol/L   Potassium 4.4 3.5 - 5.1 mmol/L   Chloride 107 101 - 111 mmol/L   CO2 26 22 - 32 mmol/L   Glucose, Bld 149 (H) 65 - 99 mg/dL   BUN 22 (H) 6 - 20 mg/dL   Creatinine, Ser 1.12 (H) 0.44 - 1.00 mg/dL   Calcium 9.5 8.9 - 10.3 mg/dL   Total Protein 7.3 6.5 - 8.1 g/dL   Albumin 3.6 3.5 - 5.0 g/dL   AST 24 15 - 41 U/L   ALT 14 14 - 54 U/L   Alkaline Phosphatase 71 38 - 126 U/L   Total Bilirubin 0.2 (L) 0.3 - 1.2 mg/dL   GFR calc non Af Amer 44 (L) >60 mL/min   GFR calc Af Amer 50 (L) >60 mL/min    Comment: (NOTE) The eGFR has been calculated using the CKD EPI equation. This calculation has not been validated in all clinical situations. eGFR's persistently <60 mL/min signify possible Chronic Kidney Disease.    Anion gap 8 5 - 15  Ethanol     Status: None   Collection Time: 07/04/15  7:52 PM  Result Value Ref Range   Alcohol, Ethyl (B) <5 <5 mg/dL    Comment:        LOWEST DETECTABLE LIMIT FOR SERUM ALCOHOL IS 5 mg/dL FOR MEDICAL PURPOSES ONLY   CBC with Differential     Status: None   Collection Time: 07/04/15  7:52 PM  Result Value Ref Range   WBC 8.8 3.6 - 11.0 K/uL   RBC 4.83 3.80 - 5.20 MIL/uL   Hemoglobin 13.5 12.0 - 16.0 g/dL   HCT 41.1 35.0 - 47.0 %   MCV 85.1 80.0 - 100.0 fL   MCH 27.9 26.0 - 34.0 pg   MCHC 32.8 32.0 - 36.0  g/dL   RDW 14.3 11.5 - 14.5 %   Platelets 434 150 - 440 K/uL   Neutrophils Relative % 72 %   Neutro Abs 6.3 1.4 - 6.5 K/uL   Lymphocytes Relative 17 %   Lymphs Abs 1.5 1.0 - 3.6 K/uL   Monocytes Relative 9 %   Monocytes Absolute 0.8 0.2 - 0.9 K/uL   Eosinophils Relative 1 %   Eosinophils Absolute 0.1 0 -  0.7 K/uL   Basophils Relative 1 %   Basophils Absolute 0.1 0 - 0.1 K/uL  Urinalysis complete, with microscopic ([facility] only)     Status: Abnormal   Collection Time: 07/04/15  9:31 PM  Result Value Ref Range   Color, Urine YELLOW (A) YELLOW   APPearance CLEAR (A) CLEAR   Glucose, UA NEGATIVE NEGATIVE mg/dL   Bilirubin Urine NEGATIVE NEGATIVE   Ketones, ur NEGATIVE NEGATIVE mg/dL   Specific Gravity, Urine 1.016 1.005 - 1.030   Hgb urine dipstick NEGATIVE NEGATIVE   pH 6.0 5.0 - 8.0   Protein, ur NEGATIVE NEGATIVE mg/dL   Nitrite NEGATIVE NEGATIVE   Leukocytes, UA NEGATIVE NEGATIVE   RBC / HPF 0-5 0 - 5 RBC/hpf   WBC, UA 0-5 0 - 5 WBC/hpf   Bacteria, UA RARE (A) NONE SEEN   Squamous Epithelial / LPF 0-5 (A) NONE SEEN   Mucous PRESENT    Hyaline Casts, UA PRESENT    Ca Oxalate Crys, UA PRESENT   Urine Drug Screen, Qualitative     Status: Abnormal   Collection Time: 07/04/15  9:31 PM  Result Value Ref Range   Tricyclic, Ur Screen NONE DETECTED NONE DETECTED   Amphetamines, Ur Screen NONE DETECTED NONE DETECTED   MDMA (Ecstasy)Ur Screen NONE DETECTED NONE DETECTED   Cocaine Metabolite,Ur [place] NONE DETECTED NONE DETECTED   Opiate, Ur Screen NONE DETECTED NONE DETECTED   Phencyclidine (PCP) Ur S NONE DETECTED NONE DETECTED   Cannabinoid 50 Ng, Ur [place] NONE DETECTED NONE DETECTED   Barbiturates, Ur Screen NONE DETECTED NONE DETECTED   Benzodiazepine, Ur Scrn POSITIVE (A) NONE DETECTED   Methadone Scn, Ur NONE DETECTED NONE DETECTED    Comment: (NOTE) 539  Tricyclics, urine               Cutoff 1000 ng/mL 200  Amphetamines, urine             Cutoff 1000 ng/mL 300  MDMA (Ecstasy),  urine           Cutoff 500 ng/mL 400  Cocaine Metabolite, urine       Cutoff 300 ng/mL 500  Opiate, urine                   Cutoff 300 ng/mL 600  Phencyclidine (PCP), urine      Cutoff 25 ng/mL 700  Cannabinoid, urine              Cutoff 50 ng/mL 800  Barbiturates, urine             Cutoff 200 ng/mL 900  Benzodiazepine, urine           Cutoff 200 ng/mL 1000 Methadone, urine                Cutoff 300 ng/mL 1100 1200 The urine drug screen provides only a preliminary, unconfirmed 1300 analytical test result and should not be used for non-medical 1400 purposes. Clinical consideration and professional judgment should 1500 be applied to any positive drug screen result due to possible 1600 interfering substances. A more specific alternate chemical method 1700 must be used in order to obtain a confirmed analytical result.  1800 Gas chromato graphy / mass spectrometry (GC/MS) is the preferred 1900 confirmatory method.     Vitals: Blood pressure 140/75, pulse 78, temperature 98.6 F (37 C), temperature source Oral, resp. rate 14, height $RemoveBe'5\' 3"'PByaNLQSz$  (1.6 m), weight 61.236 kg (135 lb), SpO2 97 %.  Risk to Self:  Is patient at risk for suicide?: No Risk to Others:   Prior Inpatient Therapy:   Prior Outpatient Therapy:    Current Facility-Administered Medications  Medication Dose Route Frequency Provider Last Rate Last Dose  . cephALEXin (KEFLEX) capsule 500 mg  500 mg Oral Q12H Gonzella Lex, MD   500 mg at 07/06/15 1005  . LORazepam (ATIVAN) 1 MG tablet        1 mg at 07/06/15 1144  . risperiDONE (RISPERDAL) tablet 1 mg  1 mg Oral QHS Gonzella Lex, MD   1 mg at 07/05/15 2221  . [START ON 07/07/2015] risperiDONE (RISPERDAL) tablet 1 mg  1 mg Oral BH-q7a Gonzella Lex, MD       Current Outpatient Prescriptions  Medication Sig Dispense Refill  . cephALEXin (KEFLEX) 500 MG capsule Take 1 capsule (500 mg total) by mouth 2 (two) times daily. (Patient taking differently: Take 500 mg by mouth 2 (two)  times daily. For 7 days) 14 capsule 0  . mirtazapine (REMERON) 7.5 MG tablet Take 7.5 mg by mouth at bedtime.    . risperiDONE (RISPERDAL) 0.5 MG tablet Take 1 tablet (0.5 mg total) by mouth 2 (two) times daily as needed (agitation). 60 tablet 0    Musculoskeletal: Strength & Muscle Tone: within normal limits Gait & Station: shuffle Patient leans: N/A  Psychiatric Specialty Exam: Physical Exam  Constitutional: She appears well-developed and well-nourished.  HENT:  Head: Normocephalic and atraumatic.  Eyes: Conjunctivae are normal. Pupils are equal, round, and reactive to light.  Neck: Normal range of motion.  Cardiovascular: Normal heart sounds.   Respiratory: Effort normal.  GI: Soft.  Musculoskeletal: Normal range of motion.  Neurological: She is alert.  Skin: Skin is warm and dry.  Psychiatric: She has a normal mood and affect. Her speech is delayed. She is slowed. Thought content is delusional. Cognition and memory are impaired. She expresses inappropriate judgment. She exhibits abnormal recent memory and abnormal remote memory.  Patient is pleasantly demented. She is awake and cooperative with the interview. She is disoriented completely. Not hostile not threatening but does tend to wander because of her confusion.    Review of Systems  Constitutional: Negative.   HENT: Negative.   Eyes: Negative.   Respiratory: Negative.   Cardiovascular: Negative.   Gastrointestinal: Negative.   Musculoskeletal: Negative.   Skin: Negative.   Neurological: Negative.   Psychiatric/Behavioral: Positive for memory loss. Negative for depression, suicidal ideas, hallucinations and substance abuse. The patient is not nervous/anxious and does not have insomnia.     Blood pressure 140/75, pulse 78, temperature 98.6 F (37 C), temperature source Oral, resp. rate 14, height $RemoveBe'5\' 3"'HcwvnUFwr$  (1.6 m), weight 61.236 kg (135 lb), SpO2 97 %.Body mass index is 23.92 kg/(m^2).  General Appearance: Disheveled  Eye  Contact::  Good  Speech:  Slow  Volume:  Decreased  Mood:  Euthymic  Affect:  Confused although she is trying to put on a good front. Has not been displaying hostility that I have seen  Thought Process:  Disorganized and Loose  Orientation:  Negative  Thought Content:  Delusions  Suicidal Thoughts:  No  Homicidal Thoughts:  No  Memory:  Immediate;   Fair Recent;   Poor Remote;   Fair  Judgement:  Impaired  Insight:  Lacking  Psychomotor Activity:  Decreased  Concentration:  Poor  Recall:  Poor  Fund of Knowledge:Poor  Language: Poor  Akathisia:  No  Handed:  Right  AIMS (if indicated):  Assets:  Physical Health Social Support  ADL's:  Intact  Cognition: Impaired,  Moderate  Sleep:      Medical Decision Making: Review of Psycho-Social Stressors (1), Review or order clinical lab tests (1), Established Problem, Worsening (2), Review or order medicine tests (1), Review of Medication Regimen & Side Effects (2) and Review of New Medication or Change in Dosage (2)  Treatment Plan Summary: Medication management and Plan It is clear that this patient's behaviors not manageable at her daughter's home. Daughter brought her in with the request that we somehow do some sort of treatment that would make the patient easy to manage at home. I think that she doesn't quite understand how difficult this might be. Clearly there is nothing were going to change here in the emergency room that is going to change the current problem. Really our only option is to try sending her to a geriatric psychiatry ward to see if they can make any sort of adjustment that will assist with the patient being manageable at home. Meanwhile I have increased the Risperdal to half milligram and a full milligram at night social work is aware and is assisting. We hope we can get her referred to a geriatric psychiatry unit. Continue the Vicente Males biotic for 5 more days  Patient has been turned down by more than one geriatric  psychiatry unit on the grounds of her needing long-term placement. Unclear if were going to be able to find a geriatric facility. If all fails we may need to look into placement and talk with the daughter about that if the daughter is still not willing or able to take the patient home. I will increase her morning Risperdal today to 1 mg. Continue monitoring in the emergency room. No other change to plan. Vital signs stable.  Plan:  Recommend psychiatric Inpatient admission when medically cleared. Supportive therapy provided about ongoing stressors. Disposition: Refer to geriatric psychiatry unit as noted above  Alethia Berthold 07/06/2015 6:47 PM

## 2015-07-06 NOTE — BHH Counselor (Signed)
Requested information faxed to [facility] [phone] ext. [phone]). Currently pending review for possible placement.

## 2015-07-06 NOTE — ED Notes (Signed)
BEHAVIORAL HEALTH ROUNDING Patient sleeping: No. Patient alert and oriented: no Behavior appropriate: Yes.  ; If no, describe:  Nutrition and fluids offered: Yes  Toileting and hygiene offered: Yes  Sitter present: yes Law enforcement present: Yes  

## 2015-07-06 NOTE — ED Notes (Signed)
BEHAVIORAL HEALTH ROUNDING Patient sleeping: Yes.   Patient alert and oriented: sleeping Behavior appropriate: Yes.  ; If no, describe: sleeping in chair Nutrition and fluids offered: snack at 2110 Toileting and hygiene offered:No and sleeping Sitter present: yes Law enforcement present: Yes

## 2015-07-06 NOTE — ED Notes (Signed)
BEHAVIORAL HEALTH ROUNDING Patient sleeping: Yes.   Patient alert and oriented: no Behavior appropriate: Yes.  ; If no, describe:  Nutrition and fluids offered: No Toileting and hygiene offered: No Sitter present: yes Law enforcement present: Yes  

## 2015-07-06 NOTE — ED Notes (Signed)
Pt is being combative and wanting to leave. Dr. Dolores Frame was notified and prescribed medication.

## 2015-07-06 NOTE — ED Notes (Signed)
BEHAVIORAL HEALTH ROUNDING Patient sleeping: No. Patient alert and oriented: oriented to person Behavior appropriate: Yes.  ; If no, describe:  Nutrition and fluids offered: Yes  Toileting and hygiene offered: Yes  Sitter present: no Law enforcement present: Yes

## 2015-07-06 NOTE — ED Provider Notes (Signed)
-----------------------------------------   6:37 AM on 07/06/2015 -----------------------------------------   Blood pressure 150/77, pulse 100, temperature 99.2 F (37.3 C), temperature source Oral, resp. rate 18, height  (1.6 m), weight 135 lb (61.236 kg), SpO2 100 %.  Patient was given IM Haldol for agitation overnight with good effect.  Calm and cooperative at this time.  Disposition is pending per clinical social worker recommendations.     Irean Hong, MD 07/06/15 [phone]

## 2015-07-06 NOTE — ED Notes (Signed)

## 2015-07-06 NOTE — ED Notes (Addendum)
BEHAVIORAL HEALTH ROUNDING Patient sleeping: No. Patient alert and oriented: no Behavior appropriate: No.; If no, describe: pt is trying to leave room, pt insists that she needs "to go home" and that "I need to feed my dogs".   Nutrition and fluids offered: Yes  Toileting and hygiene offered: Yes  Sitter present: yes Law enforcement present: Yes

## 2015-07-06 NOTE — ED Notes (Signed)
BEHAVIORAL HEALTH ROUNDING Patient sleeping: Yes.   Patient alert and oriented: sleeping Behavior appropriate: Yes.  ; If no, describe:  Nutrition and fluids offered: No and sleeping Toileting and hygiene offered: No and sleeping Sitter present: yes Law enforcement present: Yes

## 2015-07-06 NOTE — ED Notes (Signed)
dtr called to check on pt she states she will get her mother a dog she wants her mother to live with her doesn't want her going to nursing home or pysch facility.

## 2015-07-06 NOTE — Progress Notes (Signed)
Visit made to ED, writer spoke with CSW [place] via phone. Patient remains in the ED awaiting Geri -Psych placement. Discussed patient with staff RN's [address] and [address]. Writer also spoke with patient's daughter Sedalia Muta while in the ED.  Emotional support offered. Will continue to follow through final discharge. Thank you. Dayna Barker RN, BSN, [facility] Liaison [phone] c

## 2015-07-06 NOTE — ED Notes (Addendum)
Spoke to dtr at 2110 for a few minutes because pt was asleep earlier and dtr called earlier to talk. Prior to phone call pt had snack and went to the bathroom.

## 2015-07-06 NOTE — ED Notes (Addendum)
BEHAVIORAL HEALTH ROUNDING Patient sleeping: No. Patient alert and oriented: no Behavior appropriate: Yes.  ; If no, describe:  Nutrition and fluids offered: Yes  Toileting and hygiene offered: Yes  Sitter present: yes Law enforcement present: Yes  

## 2015-07-06 NOTE — ED Notes (Signed)
Pt has calmed, pt sitting in chair willingly.  Pts daughter at bedside.  Sitter present.

## 2015-07-06 NOTE — ED Notes (Signed)
ED BHU PLACEMENT JUSTIFICATION Is the patient under IVC or is there intent for IVC: Yes.   Is the patient medically cleared: Yes.   Is there vacancy in the ED BHU: Yes.   Is the population mix appropriate for patient: No. Is the patient awaiting placement in inpatient or outpatient setting: Yes.   Has the patient had a psychiatric consult: Yes.   Survey of unit performed for contraband, proper placement and condition of furniture, tampering with fixtures in bathroom, shower, and each patient room: Yes.  ; Findings: none APPEARANCE/BEHAVIOR calm and sleeping NEURO ASSESSMENT Orientation: sleeping Hallucinations: No.None noted (Hallucinations) Speech: sleeping Gait: sleeping RESPIRATORY ASSESSMENT Normal expansion.  Clear to auscultation.  No rales, rhonchi, or wheezing. CARDIOVASCULAR ASSESSMENT regular rate and rhythm, S1, S2 normal, no murmur, click, rub or gallop GASTROINTESTINAL ASSESSMENT soft, nontender, BS WNL, no r/g EXTREMITIES normal strength, tone, and muscle mass PLAN OF CARE Provide calm/safe environment. Vital signs assessed twice daily. ED BHU Assessment once each 12-hour shift. Collaborate with intake RN daily or as condition indicates. Assure the ED provider has rounded once each shift. Provide and encourage hygiene. Provide redirection as needed. Assess for escalating behavior; address immediately and inform ED provider.  Assess family dynamic and appropriateness for visitation as needed: Yes.  ; If necessary, describe findings: family supportive Educate the patient/family about BHU procedures/visitation: Yes.  ; If necessary, describe findings:family is aware of procedures and visitations

## 2015-07-06 NOTE — ED Notes (Addendum)
Pt ambulated to the bathroom accompanied by Sherilyn Cooter, RN and ED tec.

## 2015-07-07 DIAGNOSIS — G308 Other Alzheimer's disease: Secondary | ICD-10-CM | POA: Diagnosis not present

## 2015-07-07 DIAGNOSIS — F0390 Unspecified dementia without behavioral disturbance: Secondary | ICD-10-CM | POA: Diagnosis not present

## 2015-07-07 NOTE — BHH Counselor (Signed)
Counselor spoke with Jamesetta So at [facility] who requested the last 24 hours of nursing documentation be faxed to [phone]. Counselor faxed the requested paperwork and called Jamesetta So to confirm that she has received the paperwork. Jamesetta So confirmed that she has received the paperwork and she will provide it to the physician for review.

## 2015-07-07 NOTE — ED Notes (Signed)
BEHAVIORAL HEALTH ROUNDING Patient sleeping: No. Patient alert and oriented: yes Behavior appropriate: Yes.  ; If no, describe:  Nutrition and fluids offered: Yes  Toileting and hygiene offered: Yes  Sitter present: safety checks every 15 minutes Law enforcement present: security officer present  

## 2015-07-07 NOTE — ED Notes (Signed)
[facility] [facility], RN at [facility] [phone] to let them know pt. Was being transported by [facility].

## 2015-07-07 NOTE — ED Notes (Signed)

## 2015-07-07 NOTE — BHH Counselor (Signed)
Patient has been accepted to bed 9450-1 at [facility]. Accepting physician is Dr. Eliott Nine. Call report to [phone]. Representative was . ER Staff (Linda-,ER Sharalyn Ink- Patients nurse.) have been made aware of acceptance and initiated transportation.  Pt.'s Family/Support System Bobbe Medico (Daughter) [phone] has been updated by Robinette Haines as well.

## 2015-07-07 NOTE — ED Notes (Addendum)
Patient continuously coming out of the room confused and agitated.  Patient is able to be redirected and calmed down each time.

## 2015-07-07 NOTE — ED Notes (Signed)
Patient becoming emotional stating "I just want to go home".  Was able to redirect her and calm her down.  Patient's daughter will come to visit her around noon today.

## 2015-07-07 NOTE — ED Notes (Signed)
Patient assisted to restroom and then given breakfast tray afterwards. Patient denies any needs at this time.

## 2015-07-07 NOTE — Progress Notes (Signed)
ED follow up. Writer spoke with staff RN Misty and had phone conversation with CSW Gavin Pound, confirmed that Ms. Menton will be transferred to [facility]. Patient's daughter Sedalia Muta was not present, she is aware of transfer. [facility] team updated. Thank you. Dayna Barker RN, BSN, [facility] [phone] c

## 2015-07-07 NOTE — ED Notes (Signed)
BEHAVIORAL HEALTH ROUNDING Patient sleeping: Yes.   Patient alert and oriented: sleeping Behavior appropriate: Yes.  ; If no, describe:  Nutrition and fluids offered: No Toileting and hygiene offered: No Sitter present: yes Law enforcement present: Yes  

## 2015-07-07 NOTE — ED Notes (Signed)
Patient given the phone to speak with her Daughter Bobbe Medico).

## 2015-07-07 NOTE — ED Notes (Signed)
BEHAVIORAL HEALTH ROUNDING Patient sleeping: No. Patient alert and oriented: yes to self Behavior appropriate: Yes.  ; If no, describe:  Nutrition and fluids offered: Yes  Toileting and hygiene offered: Yes  Sitter present: safety checks every 15 minutes Law enforcement present: Engineer, materials present

## 2015-07-07 NOTE — ED Notes (Signed)
Called and to [employer] at

## 2015-07-07 NOTE — Consult Note (Signed)
[facility] Face-to-Face Psychiatry Consult   Reason for Consult:  Consult for this 81 year old woman brought to the emergency room by her daughter Referring Physician:  Scotty Court Patient Identification: Ruth Juarez MRN:  [id number] Principal Diagnosis: Alzheimer's dementia with behavioral disturbance Diagnosis:   Patient Active Problem List   Diagnosis Date Noted  . Alzheimer's dementia with behavioral disturbance [G30.8] 07/03/2015  . Urinary tract infection [N39.0] 07/03/2015    Total Time spent with patient: 45 minutes  Subjective:   Ruth Juarez is a 81 y.o. female patient admitted with "I really don't know".  HPI:  Information from the patient from the chart and from the patient's daughter. This 39 year old woman with a history of dementia was here in the emergency room just a couple days ago. Her daughter had brought her down from [place] with the plan that the patient would be living with her. When she got down here in [place] the daughter discovered that the patient tends to wander and can become labile and belligerent especially in the evening. It did not appear to be safe to manage her at home. When we saw her a couple days ago she still had a urinary tract infection and was given Tobi Bastos biotics and was also started on Risperdal half milligram twice a day. Since then the patient is continued to have behavior problems. She wonders away from the home in the evening. Gets confused. He gets argumentative agitated and possibly even aggressive when people try and get in her way. The patient herself denies any mood symptoms. Denies suicidal or homicidal ideation. Denies hallucinations. She is confused and disoriented. No idea where she is. Her understanding of what's going on changes from minute to minute.  The patient clearly has dementia which is probably Alzheimer's primarily possibly with vascular component. Last time she was here she had a urinary tract infection that could've  been contributing. Her urine analysis today he is clean suggesting that that is no longer an active issue. Daughter reports no previous diagnosis of any psychiatric problems although she says that her mother was "always a mean woman".  Social history: Patient had [place] living by herself [place]. Her home was filthy and in disrepair and apparently is so bad that they have condemned it on her going to tear it down. Daughter here in [place] is trying to take responsibility.  Family history: Nonidentified  Substance abuse history: No alcohol or drug abuse.  Medical history: No known significant ongoing medical problems. Still on some Keflex for urinary tract infection  Update as of Friday. Patient has had better behavior today. No aggression. She remains confused and clearly demented. Hoarding any threatening behavior. Patient has been accepted to [facility] geriatric unit. The patient is aware of this although her memory problems make it impossible for her to hold onto the plan. I have spoken with her daughter and explained the plan for transfer to a geriatric Hospital with the hope that this can improve her to where she and the daughter can spend more productive time together. No change to medication.  HPI Elements:   Quality:  Confusion with agitation. Severity:  Severe and potentially dangerous to herself with her behavior. Timing:  Happens mostly at night although she gets confused in the day as well. Duration:  Ongoing probably been present for years but worse since coming down here to [place] in a new environment. Context:  Recent change of residence..  Past Medical History: History reviewed. No  pertinent past medical history.  Past Surgical History  Procedure Laterality Date  . Abdominal hysterectomy    . Tonsillectomy     Family History: No family history on file. Social History:  History  Alcohol Use No    Comment: Patient denies       History  Drug Use No    Comment: Patient denies    History   Social History  . Marital Status: Widowed    Spouse Name: N/A  . Number of Children: N/A  . Years of Education: N/A   Social History Main Topics  . Smoking status: Never Smoker   . Smokeless tobacco: Not on file  . Alcohol Use: No     Comment: Patient denies   . Drug Use: No     Comment: Patient denies  . Sexual Activity: No   Other Topics Concern  . None   Social History Narrative   Additional Social History:                          Allergies:  No Known Allergies  Labs:  No results found for this or any previous visit (from the past 48 hour(s)).  Vitals: Blood pressure 140/75, pulse 78, temperature 98.6 F (37 C), temperature source Oral, resp. rate 14, height  (1.6 m), weight 61.236 kg (135 lb), SpO2 97 %.  Risk to Self: Is patient at risk for suicide?: No Risk to Others:   Prior Inpatient Therapy:   Prior Outpatient Therapy:    Current Facility-Administered Medications  Medication Dose Route Frequency Provider Last Rate Last Dose  . cephALEXin (KEFLEX) capsule 500 mg  500 mg Oral Q12H Audery Amel, MD   500 mg at 07/07/15 1018  . risperiDONE (RISPERDAL) tablet 1 mg  1 mg Oral QHS Audery Amel, MD   1 mg at 07/06/15 2108  . risperiDONE (RISPERDAL) tablet 1 mg  1 mg Oral BH-q7a Audery Amel, MD   1 mg at 07/07/15 1610   Current Outpatient Prescriptions  Medication Sig Dispense Refill  . cephALEXin (KEFLEX) 500 MG capsule Take 1 capsule (500 mg total) by mouth 2 (two) times daily. (Patient taking differently: Take 500 mg by mouth 2 (two) times daily. For 7 days) 14 capsule 0  . mirtazapine (REMERON) 7.5 MG tablet Take 7.5 mg by mouth at bedtime.    . risperiDONE (RISPERDAL) 0.5 MG tablet Take 1 tablet (0.5 mg total) by mouth 2 (two) times daily as needed (agitation). 60 tablet 0    Musculoskeletal: Strength & Muscle Tone: within normal limits Gait & Station: shuffle Patient  leans: N/A  Psychiatric Specialty Exam: Physical Exam  Constitutional: She appears well-developed and well-nourished.  HENT:  Head: Normocephalic and atraumatic.  Eyes: Conjunctivae are normal. Pupils are equal, round, and reactive to light.  Neck: Normal range of motion.  Cardiovascular: Normal heart sounds.   Respiratory: Effort normal.  GI: Soft.  Musculoskeletal: Normal range of motion.  Neurological: She is alert.  Skin: Skin is warm and dry.  Psychiatric: She has a normal mood and affect. Her speech is delayed. She is slowed. Thought content is delusional. Cognition and memory are impaired. She expresses inappropriate judgment. She exhibits abnormal recent memory and abnormal remote memory.  Patient is pleasantly demented. She is awake and cooperative with the interview. She is disoriented completely. Not hostile not threatening but does tend to wander because of her confusion.    Review of Systems  Constitutional: Negative.   HENT: Negative.   Eyes: Negative.   Respiratory: Negative.   Cardiovascular: Negative.   Gastrointestinal: Negative.   Musculoskeletal: Negative.   Skin: Negative.   Neurological: Negative.   Psychiatric/Behavioral: Positive for memory loss. Negative for depression, suicidal ideas, hallucinations and substance abuse. The patient is not nervous/anxious and does not have insomnia.     Blood pressure 140/75, pulse 78, temperature 98.6 F (37 C), temperature source Oral, resp. rate 14, height 5\' 3"  (1.6 m), weight 61.236 kg (135 lb), SpO2 97 %.Body mass index is 23.92 kg/(m^2).  General Appearance: Disheveled  Eye Contact::  Good  Speech:  Slow  Volume:  Decreased  Mood:  Euthymic  Affect:  Confused although she is trying to put on a good front. Has not been displaying hostility that I have seen  Thought Process:  Disorganized and Loose  Orientation:  Negative  Thought Content:  Delusions  Suicidal Thoughts:  No  Homicidal Thoughts:  No  Memory:   Immediate;   Fair Recent;   Poor Remote;   Fair  Judgement:  Impaired  Insight:  Lacking  Psychomotor Activity:  Decreased  Concentration:  Poor  Recall:  Poor  Fund of Knowledge:Poor  Language: Poor  Akathisia:  No  Handed:  Right  AIMS (if indicated):     Assets:  Physical Health Social Support  ADL's:  Intact  Cognition: Impaired,  Moderate  Sleep:      Medical Decision Making: Review of Psycho-Social Stressors (1), Review or order clinical lab tests (1), Established Problem, Worsening (2), Review or order medicine tests (1), Review of Medication Regimen & Side Effects (2) and Review of New Medication or Change in Dosage (2)  Treatment Plan Summary: Medication management and Plan It is clear that this patient's behaviors not manageable at her daughter's home. Daughter brought her in with the request that we somehow do some sort of treatment that would make the patient easy to manage at home. I think that she doesn't quite understand how difficult this might be. Clearly there is nothing were going to change here in the emergency room that is going to change the current problem. Really our only option is to try sending her to a geriatric psychiatry ward to see if they can make any sort of adjustment that will assist with the patient being manageable at home. Meanwhile I have increased the Risperdal to half milligram and a full milligram at night social work is aware and is assisting. We hope we can get her referred to a geriatric psychiatry unit. Continue the Tobi Bastos biotic for 5 more days  Patient has now been accepted to [facility]. Transfer is underway. Commitment papers filed to assist in transfer. Daughter is aware of plan. Patient has been cooperative. Plan:  Recommend psychiatric Inpatient admission when medically cleared. Supportive therapy provided about ongoing stressors. Disposition: Refer to geriatric psychiatry unit as noted above  Mordecai Rasmussen 07/07/2015 4:26 PM

## 2015-07-07 NOTE — ED Notes (Signed)
BEHAVIORAL HEALTH ROUNDING Patient sleeping: Yes.   Patient alert and oriented: no Behavior appropriate: Yes.  ; If no, describe: sleeping Nutrition and fluids offered: No Toileting and hygiene offered: No Sitter present: yes Law enforcement present: Yes

## 2015-07-07 NOTE — ED Provider Notes (Signed)
-----------------------------------------   6:30 AM on 07/07/2015 -----------------------------------------   Blood pressure 140/75, pulse 78, temperature 98.6 F (37 C), temperature source Oral, resp. rate 14, height  (1.6 m), weight 135 lb (61.236 kg), SpO2 97 %.  The patient had no acute events since last update.  Calm and cooperative at this time.  Patient was seen in consultation by psychiatry yesterday. Disposition is pending per Psychiatry/Behavioral Medicine team recommendations.     Irean Hong, MD 07/07/15 0630

## 2015-07-07 NOTE — ED Notes (Signed)
BEHAVIORAL HEALTH ROUNDING Patient sleeping: No. Patient alert and oriented: yes- oriented to herself Behavior appropriate: Yes.  ; If no, describe:  Nutrition and fluids offered: Yes  Toileting and hygiene offered: Yes  Sitter present: safety checks every 15 minutes Law enforcement present: Engineer, materials present

## 2015-07-07 NOTE — ED Notes (Signed)
Per the counselor, patient has received a bed at [facility].

## 2015-07-07 NOTE — ED Notes (Signed)
Called and talked to Daughter of pt. (Diane White) to let her know pt. Was being transferred to [facility].  Daughter was tearful but grateful for what [facility] had done for her mother.

## 2015-07-20 ENCOUNTER — Emergency Department
Admission: EM | Admit: 2015-07-20 | Discharge: 2015-07-24 | Disposition: A | Discharge status: Other Acute Inpatient Hospital | Payer: Medicare Other | Attending: Emergency Medicine | Admitting: Emergency Medicine

## 2015-07-20 ENCOUNTER — Encounter: Payer: Self-pay | Admitting: Emergency Medicine

## 2015-07-20 DIAGNOSIS — F0281 Dementia in other diseases classified elsewhere with behavioral disturbance: Secondary | ICD-10-CM | POA: Diagnosis present

## 2015-07-20 DIAGNOSIS — Z79899 Other long term (current) drug therapy: Secondary | ICD-10-CM | POA: Diagnosis not present

## 2015-07-20 DIAGNOSIS — F02818 Dementia in other diseases classified elsewhere, unspecified severity, with other behavioral disturbance: Secondary | ICD-10-CM | POA: Diagnosis present

## 2015-07-20 DIAGNOSIS — G308 Other Alzheimer's disease: Secondary | ICD-10-CM | POA: Diagnosis not present

## 2015-07-20 DIAGNOSIS — F0391 Unspecified dementia with behavioral disturbance: Secondary | ICD-10-CM | POA: Diagnosis not present

## 2015-07-20 DIAGNOSIS — G309 Alzheimer's disease, unspecified: Secondary | ICD-10-CM

## 2015-07-20 DIAGNOSIS — Z046 Encounter for general psychiatric examination, requested by authority: Secondary | ICD-10-CM | POA: Diagnosis present

## 2015-07-20 HISTORY — DX: Unspecified dementia, unspecified severity, without behavioral disturbance, psychotic disturbance, mood disturbance, and anxiety: F03.90

## 2015-07-20 LAB — CBC WITH DIFFERENTIAL/PLATELET
BASOS ABS: 0.1 10*3/uL (ref 0–0.1)
Basophils Relative: 1 %
EOS ABS: 0.1 10*3/uL (ref 0–0.7)
Eosinophils Relative: 1 %
HCT: 38.4 % (ref 35.0–47.0)
HEMOGLOBIN: 12.7 g/dL (ref 12.0–16.0)
LYMPHS PCT: 14 %
Lymphs Abs: 1.3 10*3/uL (ref 1.0–3.6)
MCH: 27.8 pg (ref 26.0–34.0)
MCHC: 33.1 g/dL (ref 32.0–36.0)
MCV: 84.1 fL (ref 80.0–100.0)
MONOS PCT: 8 %
Monocytes Absolute: 0.8 10*3/uL (ref 0.2–0.9)
Neutro Abs: 6.8 10*3/uL — ABNORMAL HIGH (ref 1.4–6.5)
Neutrophils Relative %: 76 %
Platelets: 276 10*3/uL (ref 150–440)
RBC: 4.57 MIL/uL (ref 3.80–5.20)
RDW: 15 % — ABNORMAL HIGH (ref 11.5–14.5)
WBC: 9 10*3/uL (ref 3.6–11.0)

## 2015-07-20 LAB — SALICYLATE LEVEL: Salicylate Lvl: <4 mg/dL (ref 2.8–30.0)

## 2015-07-20 LAB — COMPREHENSIVE METABOLIC PANEL
ALBUMIN: 3.7 g/dL (ref 3.5–5.0)
ALK PHOS: 61 U/L (ref 38–126)
ALT: 13 U/L — AB (ref 14–54)
AST: 23 U/L (ref 15–41)
Anion gap: 10 (ref 5–15)
BUN: 25 mg/dL — ABNORMAL HIGH (ref 6–20)
CALCIUM: 9.4 mg/dL (ref 8.9–10.3)
CHLORIDE: 107 mmol/L (ref 101–111)
CO2: 23 mmol/L (ref 22–32)
CREATININE: 0.9 mg/dL (ref 0.44–1.00)
GFR calc Af Amer: >60 mL/min (ref >60)
GFR calc non Af Amer: 57 mL/min — ABNORMAL LOW (ref >60)
GLUCOSE: 109 mg/dL — AB (ref 65–99)
Potassium: 3.9 mmol/L (ref 3.5–5.1)
Sodium: 140 mmol/L (ref 135–145)
Total Bilirubin: 0.3 mg/dL (ref 0.3–1.2)
Total Protein: 6.8 g/dL (ref 6.5–8.1)

## 2015-07-20 LAB — ETHANOL: Alcohol, Ethyl (B): <5 mg/dL (ref <5)

## 2015-07-20 LAB — LIPASE, BLOOD: Lipase: 28 U/L (ref 22–51)

## 2015-07-20 LAB — ACETAMINOPHEN LEVEL

## 2015-07-20 MED ORDER — VITAMIN B-12 1000 MCG PO TABS
1000.0000 ug | ORAL_TABLET | Freq: Every day | ORAL | Status: DC
  Administered 2015-07-21 – 2015-07-24 (×4): 1000 ug via ORAL
  Filled 2015-07-20 (×6): qty 1

## 2015-07-20 MED ORDER — MIRTAZAPINE 7.5 MG PO TABS
7.5000 mg | ORAL_TABLET | Freq: Every day | ORAL | Status: DC
  Administered 2015-07-20 – 2015-07-23 (×3): 7.5 mg via ORAL
  Filled 2015-07-20 (×6): qty 1

## 2015-07-20 MED ORDER — LORAZEPAM 1 MG PO TABS
1.0000 mg | ORAL_TABLET | ORAL | Status: DC | PRN
  Administered 2015-07-21 – 2015-07-24 (×3): 1 mg via ORAL
  Filled 2015-07-20 (×3): qty 1

## 2015-07-20 MED ORDER — CALCIUM CARBONATE ANTACID 500 MG PO CHEW
1.0000 | CHEWABLE_TABLET | ORAL | Status: DC | PRN
  Filled 2015-07-20: qty 1

## 2015-07-20 MED ORDER — ADULT MULTIVITAMIN W/MINERALS CH
1.0000 | ORAL_TABLET | Freq: Once | ORAL | Status: AC
  Administered 2015-07-21: 1 via ORAL
  Filled 2015-07-20: qty 1

## 2015-07-20 MED ORDER — ACETAMINOPHEN 325 MG PO TABS
650.0000 mg | ORAL_TABLET | Freq: Four times a day (QID) | ORAL | Status: DC | PRN
  Administered 2015-07-22: 650 mg via ORAL
  Filled 2015-07-20: qty 2

## 2015-07-20 MED ORDER — ZIPRASIDONE MESYLATE 20 MG IM SOLR
10.0000 mg | Freq: Once | INTRAMUSCULAR | Status: AC
  Administered 2015-07-20: 10 mg via INTRAMUSCULAR

## 2015-07-20 MED ORDER — ZIPRASIDONE MESYLATE 20 MG IM SOLR
INTRAMUSCULAR | Status: AC
  Administered 2015-07-20: 10 mg via INTRAMUSCULAR
  Filled 2015-07-20: qty 20

## 2015-07-20 MED ORDER — ESCITALOPRAM OXALATE 5 MG PO TABS
5.0000 mg | ORAL_TABLET | Freq: Three times a day (TID) | ORAL | Status: DC
  Administered 2015-07-20 – 2015-07-21 (×4): 5 mg via ORAL
  Filled 2015-07-20 (×8): qty 1

## 2015-07-20 MED ORDER — NON FORMULARY: 1.0000 mg | Freq: Every day | Status: DC

## 2015-07-20 MED ORDER — TRAZODONE HCL 50 MG PO TABS
50.0000 mg | ORAL_TABLET | Freq: Every evening | ORAL | Status: DC | PRN
  Administered 2015-07-22 (×2): 50 mg via ORAL
  Filled 2015-07-20 (×2): qty 1

## 2015-07-20 NOTE — ED Notes (Addendum)
BEHAVIORAL HEALTH ROUNDING Patient sleeping: No. Patient alert and oriented: pt oriented to self. Pt with baseline dementia currently worried about dogs that were in home in [place], that daughter moved pt from approximately 1 month ago. Home in [place] probably due to be condemned due to conditions pt was living in when pt picked up by daughter. Behavior appropriate: Yes.   Nutrition and fluids offered: Yes  Toileting and hygiene offered: Yes  Sitter present: q15 min observations Law enforcement present: Yes [employer]  ENVIRONMENTAL ASSESSMENT Potentially harmful objects out of patient reach: Yes.   Personal belongings secured: Yes.   Patient dressed in hospital provided attire only: Yes.   Plastic bags out of patient reach: Yes.   Patient care equipment (cords, cables, call bells, lines, and drains) shortened, removed, or accounted for: Yes.   Equipment and supplies removed from bottom of stretcher: Yes.   Potentially toxic materials out of patient reach: Yes.   Sharps container removed or out of patient reach: Yes.

## 2015-07-20 NOTE — ED Notes (Signed)
Pt here by [facility] for aggressive behaviors, daughter is at courthouse now getting IVC papers. Pt has been seen here recently for the same, pt with hx of dementia.

## 2015-07-20 NOTE — ED Notes (Signed)
Pt changed into paper scrubs by Pam, EDT.

## 2015-07-20 NOTE — ED Notes (Signed)

## 2015-07-20 NOTE — ED Notes (Addendum)
BEHAVIORAL HEALTH ROUNDING Patient sleeping: No. Patient alert and oriented: yes, pt oriented to self only Behavior appropriate: Yes.   Nutrition and fluids offered: Yes  Toileting and hygiene offered: Yes  Sitter present: q15 min observations Law enforcement present: Yes [employer]

## 2015-07-20 NOTE — ED Notes (Signed)
BEHAVIORAL HEALTH ROUNDING Patient sleeping: No. Patient alert and oriented: no Behavior appropriate: Yes.  ; If no, describe:  Nutrition and fluids offered: Yes  Toileting and hygiene offered: Yes  Sitter present: yes Law enforcement present: Yes [employer] 

## 2015-07-20 NOTE — ED Notes (Signed)
Spoke with MD about pt's increasing anxiety. Received verbal order for  IM geodon.
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
## 2015-07-20 NOTE — ED Notes (Signed)
Report received from Tiburcio Bash, RN

## 2015-07-20 NOTE — BH Assessment (Signed)
Assessment Note  Ruth Juarez is an 81 y.o. female presenting to the ED via her daughter. Patient has a history of dementia and has been to the ED previously. Patient tends to wander and can become labile and belligerent especially in the evening. Daughter reports patient gets confused and can become argumentative, agitated and possibly even aggressive when people try and get in her way. Patient  confused and disoriented. Her understanding of what's going on changes from minute to minute.  Patient's daughter, Bobbe Medico ([phone]) states that she does not want her mother removed from the home and feels that there is some type of medication available to control patient's aggressive behaviors.  Axis I: See current hospital problem list Axis II: Deferred Axis III:  Past Medical History  Diagnosis Date  . Dementia    Axis IV: problems with primary support group Axis V: 61-70 mild symptoms  Past Medical History:  Past Medical History  Diagnosis Date  . Dementia     Past Surgical History  Procedure Laterality Date  . Abdominal hysterectomy    . Tonsillectomy      Family History: No family history on file.  Social History:  reports that she has never smoked. She does not have any smokeless tobacco history on file. She reports that she does not drink alcohol or use illicit drugs.  Additional Social History:  Alcohol / Drug Use History of alcohol / drug use?: No history of alcohol / drug abuse  CIWA: CIWA-Ar BP: (!) 147/117 mmHg Pulse Rate: (!) 112 COWS:    Allergies: No Known Allergies  Home Medications:  (Not in a hospital admission)  OB/GYN Status:  No LMP recorded. Patient is postmenopausal.  General Assessment Data Location of Assessment: [facility] ED TTS Assessment: In system Is this a Tele or Face-to-Face Assessment?: Face-to-Face Is this an Initial Assessment or a Re-assessment for this encounter?: Initial Assessment Marital status: Widowed Is patient  pregnant?: No Pregnancy Status: No Living Arrangements: Children Can pt return to current living arrangement?: Yes Admission Status: Voluntary Is patient capable of signing voluntary admission?: No Referral Source: Self/Family/Friend Insurance type: Medicare     Crisis Care Plan Living Arrangements: Children Name of Psychiatrist: None Name of Therapist: None  Education Status Is patient currently in school?: No Name of school: [employer]  Risk to self with the past 6 months Suicidal Ideation: No Has patient been a risk to self within the past 6 months prior to admission? : No Suicidal Intent: No Has patient had any suicidal intent within the past 6 months prior to admission? : No Is patient at risk for suicide?: No Suicidal Plan?: No Has patient had any suicidal plan within the past 6 months prior to admission? : No Access to Means: No What has been your use of drugs/alcohol within the last 12 months?: None Previous Attempts/Gestures: No How many times?: 0 Other Self Harm Risks: 0 Triggers for Past Attempts: None known Intentional Self Injurious Behavior: None Family Suicide History: No, Unknown Recent stressful life event(s): Conflict (Comment) Persecutory voices/beliefs?: No Depression: No Depression Symptoms: Feeling angry/irritable Substance abuse history and/or treatment for substance abuse?: No Suicide prevention information given to non-admitted patients: Not applicable  Risk to Others within the past 6 months Homicidal Ideation: No Does patient have any lifetime risk of violence toward others beyond the six months prior to admission? : No Thoughts of Harm to Others: No Current Homicidal Intent: No Current Homicidal Plan: No Access to Homicidal Means: No Identified Victim: None  History of harm to others?: No Assessment of Violence: None Noted Violent Behavior Description: Pt becomes physically aggressive towards daughter. Does patient have access to weapons?:  No Criminal Charges Pending?: No Does patient have a court date: No Is patient on probation?: No  Psychosis Hallucinations: None noted Delusions: None noted  Mental Status Report Appearance/Hygiene: In scrubs Eye Contact: Good Motor Activity: Freedom of movement Speech: Incoherent Level of Consciousness: Quiet/awake Mood: Helpless, Fearful Affect: Appropriate to circumstance Anxiety Level: Minimal Thought Processes: Irrelevant Judgement: Impaired Orientation: Place, Person Obsessive Compulsive Thoughts/Behaviors: None  Cognitive Functioning Concentration: Decreased Memory: Recent Impaired IQ: Average Insight: Poor Impulse Control: Poor Appetite: Fair Weight Loss: 0 Weight Gain: 0 Sleep: Increased Total Hours of Sleep: 8 Vegetative Symptoms: None  ADLScreening [facility] Assessment Services) Patient's cognitive ability adequate to safely complete daily activities?: Yes Patient able to express need for assistance with ADLs?: Yes Independently performs ADLs?: No  Prior Inpatient Therapy Prior Inpatient Therapy: Yes Prior Therapy Facilty/Provider(s): [facility] Reason for Treatment: Altered mental state  Prior Outpatient Therapy Prior Outpatient Therapy: No  ADL Screening (condition at time of admission) Patient's cognitive ability adequate to safely complete daily activities?: Yes Patient able to express need for assistance with ADLs?: Yes Independently performs ADLs?: No       Abuse/Neglect Assessment (Assessment to be complete while patient is alone) Physical Abuse: Denies Verbal Abuse: Denies Sexual Abuse: Denies Exploitation of patient/patient's resources: Denies Self-Neglect: Denies Values / Beliefs Cultural Requests During Hospitalization: None Spiritual Requests During Hospitalization: None Consults Spiritual Care Consult Needed: No Social Work Consult Needed: No Merchant navy officer (For Healthcare) Does patient have an advance directive?: No Would  patient like information on creating an advanced directive?: No - patient declined information    Additional Information 1:1 In Past 12 Months?: No CIRT Risk: No Elopement Risk: No     Disposition:  Disposition Initial Assessment Completed for this Encounter: Yes Disposition of Patient: Other dispositions Other disposition(s): Other (Comment) (Psych MD Consult)  On Site Evaluation by:   Reviewed with Physician:    [place]ist [place] 07/20/2015 8:24 PM

## 2015-07-20 NOTE — ED Notes (Signed)
Pt asked that I come into the room and talk with her. When I entered the room, pt could not remember initially remember why she wanted to talk with me. Then pt went on to ask me if her daughter Ruth Juarez knows that she is here and stated "I am so worried about my dogs". I discussed with pt that Diane does know that she is here, and would want her to try and get some sleep tonight. I also discussed that if the dogs were taken away they were probably take to a place that is making sure that they are fed, given water and allowed to go for walks or run around outside. Pt asked these questions multiple times while I was in the room and I reassured with the same answers each time. I also informed the pt that the ED Rover Bonita Quin, the Officer that she saw when she came into the room and I will be here all night to make sure that she stays safe. By the time I left the room , pt appeared a bit more relaxed and was resting on stretcher with head on pillow watching tv.

## 2015-07-21 DIAGNOSIS — G308 Other Alzheimer's disease: Secondary | ICD-10-CM | POA: Diagnosis not present

## 2015-07-21 DIAGNOSIS — F0391 Unspecified dementia with behavioral disturbance: Secondary | ICD-10-CM | POA: Diagnosis not present

## 2015-07-21 LAB — URINALYSIS COMPLETE WITH MICROSCOPIC (ARMC ONLY)
Bacteria, UA: NONE SEEN
Bilirubin Urine: NEGATIVE
Glucose, UA: NEGATIVE mg/dL
KETONES UR: NEGATIVE mg/dL
Leukocytes, UA: NEGATIVE
NITRITE: NEGATIVE
PROTEIN: NEGATIVE mg/dL
SPECIFIC GRAVITY, URINE: 1.016 (ref 1.005–1.030)
pH: 6 (ref 5.0–8.0)

## 2015-07-21 LAB — URINE DRUG SCREEN, QUALITATIVE (ARMC ONLY)
Amphetamines, Ur Screen: NOT DETECTED
Barbiturates, Ur Screen: NOT DETECTED
Benzodiazepine, Ur Scrn: NOT DETECTED
CANNABINOID 50 NG, UR ~~LOC~~: NOT DETECTED
Cocaine Metabolite,Ur ~~LOC~~: NOT DETECTED
MDMA (ECSTASY) UR SCREEN: NOT DETECTED
METHADONE SCREEN, URINE: NOT DETECTED
OPIATE, UR SCREEN: NOT DETECTED
PHENCYCLIDINE (PCP) UR S: NOT DETECTED
Tricyclic, Ur Screen: NOT DETECTED

## 2015-07-21 MED ORDER — LORAZEPAM 0.5 MG PO TABS
0.5000 mg | ORAL_TABLET | Freq: Once | ORAL | Status: AC
  Administered 2015-07-21: 0.5 mg via ORAL
  Filled 2015-07-21: qty 1

## 2015-07-21 MED ORDER — MIRTAZAPINE 15 MG PO TABS
ORAL_TABLET | ORAL | Status: AC
  Administered 2015-07-21: 7.5 mg via ORAL
  Filled 2015-07-21: qty 1

## 2015-07-21 MED ORDER — QUETIAPINE FUMARATE 25 MG PO TABS
25.0000 mg | ORAL_TABLET | Freq: Three times a day (TID) | ORAL | Status: DC
  Administered 2015-07-21 – 2015-07-24 (×10): 25 mg via ORAL
  Filled 2015-07-21 (×10): qty 1

## 2015-07-21 MED ORDER — ESCITALOPRAM OXALATE 10 MG PO TABS
ORAL_TABLET | ORAL | Status: AC
  Administered 2015-07-21: 5 mg via ORAL
  Filled 2015-07-21: qty 1

## 2015-07-21 MED ORDER — ESCITALOPRAM OXALATE 5 MG PO TABS
5.0000 mg | ORAL_TABLET | Freq: Every day | ORAL | Status: DC
  Administered 2015-07-22 – 2015-07-24 (×3): 5 mg via ORAL
  Filled 2015-07-21 (×4): qty 1

## 2015-07-21 NOTE — ED Notes (Signed)
BEHAVIORAL HEALTH ROUNDING Patient sleeping: Yes.   Patient alert and oriented: not applicable Behavior appropriate: Yes.    Nutrition and fluids offered: No Toileting and hygiene offered: No Sitter present: q15 minute observations Law enforcement present: Yes [employer] 

## 2015-07-21 NOTE — ED Notes (Signed)
Ruth Juarez, Hospice RN visited pt, pt reports seeing dogs in the hallway and very tearful

## 2015-07-21 NOTE — ED Notes (Signed)
Per pt request and MD Malinda confirmation, will adminsiter 0.5 mg of Ativan.

## 2015-07-21 NOTE — ED Notes (Signed)
Recliner put in room, pt sitting in recliner in no distress

## 2015-07-21 NOTE — ED Provider Notes (Signed)
EKG: Interpreted by me, sinus rhythm with occasional PVCs, rate is 80 bpm, septal infarct age indeterminate, normal axis. No evidence of acute infarction.  Patient is pending to her psych placement, currently vitals are stable.  Emily Filbert, MD 07/21/15 [phone]

## 2015-07-21 NOTE — ED Notes (Signed)

## 2015-07-21 NOTE — Progress Notes (Signed)
ED visit made. Patient is currently followed at home by [facility] for Dementia, receiving Nursing and Social Work services. Patient brought to the ED from her daughter's home for evaluation of aggressive behavior. Of note patient has had previous visits for this and was released from [facility] on 07/11/15. Patient seen sitting up in the recliner, alternating between tearful and smiling. She repeatedly spoke about " her" and how she "gave away her dogs". This is a recurrent theme. At one point Ms. Ly thought there was a small dog in the hallway. Her daughter was not present during this visit. Writer spoke with staff RN Zollie Scale, patient to remain in the ED pending psychiatric consult. Will continue to follow through final disposition and up date [facility] team. Dayna Barker RN, BSN, [facility], [facility] [phone] c

## 2015-07-21 NOTE — ED Notes (Signed)
BEHAVIORAL HEALTH ROUNDING Patient sleeping: No. Patient alert and oriented: no Behavior appropriate: No. Nutrition and fluids offered: Yes  Toileting and hygiene offered: Yes  Sitter present: yes Law enforcement present: Yes   Pt given dinner tray

## 2015-07-21 NOTE — ED Notes (Signed)
BEHAVIORAL HEALTH ROUNDING Patient sleeping: No. Patient alert and oriented: no Behavior appropriate: Yes.   Nutrition and fluids offered: Yes  Toileting and hygiene offered: Yes  Sitter present: yes Law enforcement present: Yes   Pt given dinner tray, pt ate 100%

## 2015-07-21 NOTE — BHH Counselor (Signed)
Referral information for Geriatric Placement have been faxed to;    [facility]([phone])   [phone])    [facility]([phone])    [facility]([phone])    [phone])    [phone])    [facility] [phone])    [facility] [phone])   [facility]([phone])   [facility] [phone])

## 2015-07-21 NOTE — Consult Note (Signed)
[facility] Face-to-Face Psychiatry Consult   Reason for Consult:  Consult for this 81 year old woman with dementia brought in under involuntary commitment by her daughter Referring Physician:  Jimmye Norman Patient Identification: Ruth Juarez MRN:  [id number] Principal Diagnosis: Alzheimer's dementia with behavioral disturbance Diagnosis:   Patient Active Problem List   Diagnosis Date Noted  . Alzheimer's dementia with behavioral disturbance [G30.8] 07/03/2015  . Urinary tract infection [N39.0] 07/03/2015    Total Time spent with patient: 1 hour  Subjective:   Shamaya Kauer is a 81 y.o. female patient admitted with "I don't know why she did this" . patient is not a good historian to say the least. Most of the more meaningful history comes from speaking with the daughter  HPI:  Information from the patient and the patient's daughter. Patient's daughter reports that she took her mother out of [facility] after just about 4 days of her last admission. Mother is been back home for over a week. She's had a few episodes of getting agitated but yesterday was the worst. She got herself gradually worked up over various things getting more and more confused. She was insisting on leaving the house. Getting aggressive and threatening her daughter. Clearly very demented with no understanding of what's going on. Patient is fixed on the paranoid idea that her daughter has stolen her money and will not understand the actual chain of events that are happening. There has been no known or change to medicine. She has not had any new injury or new medical problem. No other specific new stress identified. She is currently taking low dose mirtazapine and Lexapro as well as Risperdal. Daughter reports that she's been compliant with her medicine.  Patient has been to our emergency room 3 times now. She is demented with behavior disturbance. It's not clear that there was any prior mental health treatment before the onset  of her dementia. No history of suicide attempts. She has a history of some aggression in the context of her dementia. The daughter describes her mother as having "always been a mean person."  Medical history: She had a urinary tract infection last time she was here but that doesn't seem to be an issue at this time. Anemia. No other significant ongoing medical problems known. She does have a couple of high blood sugars but no known diagnosis of diabetes.  Social history: Patient has been living with her daughter down here in [place] for about a month now. Prior to that she had been living independently in [place]. The state of [place] had condemned her house and was going to take custody of her when the daughter insisted on bringing the patient down here to [place]. It's been a struggle to take care of her ever since.  Family history: None known  Substance abuse history: Nonidentified HPI Elements:   Quality:  Confusion and dementia agitation. Severity:  Moderate at least with troubling aggression. Timing:  Symptoms seem to of been present possibly for more than a year but we've been following him for about a month. Duration:  She is still agitated today. Context:  Recent dislocation from home.  Past Medical History:  Past Medical History  Diagnosis Date  . Dementia     Past Surgical History  Procedure Laterality Date  . Abdominal hysterectomy    . Tonsillectomy     Family History: No family history on file. Social History:  History  Alcohol Use No    Comment: Patient denies  History  Drug Use No    Comment: Patient denies    History   Social History  . Marital Status: Widowed    Spouse Name: N/A  . Number of Children: N/A  . Years of Education: N/A   Social History Main Topics  . Smoking status: Never Smoker   . Smokeless tobacco: Not on file  . Alcohol Use: No     Comment: Patient denies   . Drug Use: No     Comment: Patient denies  .  Sexual Activity: No   Other Topics Concern  . None   Social History Narrative   Additional Social History:    History of alcohol / drug use?: No history of alcohol / drug abuse                     Allergies:  No Known Allergies  Labs:  Results for orders placed or performed during the hospital encounter of 07/20/15 (from the past 48 hour(s))  CBC with Differential     Status: Abnormal   Collection Time: 07/20/15  6:47 PM  Result Value Ref Range   WBC 9.0 3.6 - 11.0 K/uL   RBC 4.57 3.80 - 5.20 MIL/uL   Hemoglobin 12.7 12.0 - 16.0 g/dL   HCT 38.4 35.0 - 47.0 %   MCV 84.1 80.0 - 100.0 fL   MCH 27.8 26.0 - 34.0 pg   MCHC 33.1 32.0 - 36.0 g/dL   RDW 15.0 (H) 11.5 - 14.5 %   Platelets 276 150 - 440 K/uL   Neutrophils Relative % 76 %   Neutro Abs 6.8 (H) 1.4 - 6.5 K/uL   Lymphocytes Relative 14 %   Lymphs Abs 1.3 1.0 - 3.6 K/uL   Monocytes Relative 8 %   Monocytes Absolute 0.8 0.2 - 0.9 K/uL   Eosinophils Relative 1 %   Eosinophils Absolute 0.1 0 - 0.7 K/uL   Basophils Relative 1 %   Basophils Absolute 0.1 0 - 0.1 K/uL  Comprehensive metabolic panel     Status: Abnormal   Collection Time: 07/20/15  6:47 PM  Result Value Ref Range   Sodium 140 135 - 145 mmol/L   Potassium 3.9 3.5 - 5.1 mmol/L   Chloride 107 101 - 111 mmol/L   CO2 23 22 - 32 mmol/L   Glucose, Bld 109 (H) 65 - 99 mg/dL   BUN 25 (H) 6 - 20 mg/dL   Creatinine, Ser 0.90 0.44 - 1.00 mg/dL   Calcium 9.4 8.9 - 10.3 mg/dL   Total Protein 6.8 6.5 - 8.1 g/dL   Albumin 3.7 3.5 - 5.0 g/dL   AST 23 15 - 41 U/L   ALT 13 (L) 14 - 54 U/L   Alkaline Phosphatase 61 38 - 126 U/L   Total Bilirubin 0.3 0.3 - 1.2 mg/dL   GFR calc non Af Amer 57 (L) >60 mL/min   GFR calc Af Amer >60 >60 mL/min    Comment: (NOTE) The eGFR has been calculated using the CKD EPI equation. This calculation has not been validated in all clinical situations. eGFR's persistently <60 mL/min signify possible Chronic Kidney Disease.     Anion gap 10 5 - 15  Lipase, blood     Status: None   Collection Time: 07/20/15  6:47 PM  Result Value Ref Range   Lipase 28 22 - 51 U/L  Acetaminophen level     Status: Abnormal   Collection Time: 07/20/15  6:47 PM  Result Value Ref Range   Acetaminophen (Tylenol), Serum <10 (L) 10 - 30 ug/mL    Comment:        THERAPEUTIC CONCENTRATIONS VARY SIGNIFICANTLY. A RANGE OF 10-30 ug/mL MAY BE AN EFFECTIVE CONCENTRATION FOR MANY PATIENTS. HOWEVER, SOME ARE BEST TREATED AT CONCENTRATIONS OUTSIDE THIS RANGE. ACETAMINOPHEN CONCENTRATIONS >150 ug/mL AT 4 HOURS AFTER INGESTION AND >50 ug/mL AT 12 HOURS AFTER INGESTION ARE OFTEN ASSOCIATED WITH TOXIC REACTIONS.   Ethanol     Status: None   Collection Time: 07/20/15  6:47 PM  Result Value Ref Range   Alcohol, Ethyl (B) <5 <5 mg/dL    Comment:        LOWEST DETECTABLE LIMIT FOR SERUM ALCOHOL IS 5 mg/dL FOR MEDICAL PURPOSES ONLY   Salicylate level     Status: None   Collection Time: 07/20/15  6:47 PM  Result Value Ref Range   Salicylate Lvl <5.8 2.8 - 30.0 mg/dL  Urinalysis complete, with microscopic ([facility] only)     Status: Abnormal   Collection Time: 07/21/15  3:31 AM  Result Value Ref Range   Color, Urine YELLOW (A) YELLOW   APPearance CLEAR (A) CLEAR   Glucose, UA NEGATIVE NEGATIVE mg/dL   Bilirubin Urine NEGATIVE NEGATIVE   Ketones, ur NEGATIVE NEGATIVE mg/dL   Specific Gravity, Urine 1.016 1.005 - 1.030   Hgb urine dipstick 2+ (A) NEGATIVE   pH 6.0 5.0 - 8.0   Protein, ur NEGATIVE NEGATIVE mg/dL   Nitrite NEGATIVE NEGATIVE   Leukocytes, UA NEGATIVE NEGATIVE   RBC / HPF 6-30 0 - 5 RBC/hpf   WBC, UA 0-5 0 - 5 WBC/hpf   Bacteria, UA NONE SEEN NONE SEEN   Squamous Epithelial / LPF 0-5 (A) NONE SEEN   Mucous PRESENT   Urine Drug Screen, Qualitative ([facility] only)     Status: None   Collection Time: 07/21/15  3:31 AM  Result Value Ref Range   Tricyclic, Ur Screen NONE DETECTED NONE DETECTED   Amphetamines, Ur Screen NONE  DETECTED NONE DETECTED   MDMA (Ecstasy)Ur Screen NONE DETECTED NONE DETECTED   Cocaine Metabolite,Ur [place] NONE DETECTED NONE DETECTED   Opiate, Ur Screen NONE DETECTED NONE DETECTED   Phencyclidine (PCP) Ur S NONE DETECTED NONE DETECTED   Cannabinoid 50 Ng, Ur [place] NONE DETECTED NONE DETECTED   Barbiturates, Ur Screen NONE DETECTED NONE DETECTED   Benzodiazepine, Ur Scrn NONE DETECTED NONE DETECTED   Methadone Scn, Ur NONE DETECTED NONE DETECTED    Comment: (NOTE) 527  Tricyclics, urine               Cutoff 1000 ng/mL 200  Amphetamines, urine             Cutoff 1000 ng/mL 300  MDMA (Ecstasy), urine           Cutoff 500 ng/mL 400  Cocaine Metabolite, urine       Cutoff 300 ng/mL 500  Opiate, urine                   Cutoff 300 ng/mL 600  Phencyclidine (PCP), urine      Cutoff 25 ng/mL 700  Cannabinoid, urine              Cutoff 50 ng/mL 800  Barbiturates, urine             Cutoff 200 ng/mL 900  Benzodiazepine, urine           Cutoff 200 ng/mL 1000 Methadone, urine  Cutoff 300 ng/mL 1100 1200 The urine drug screen provides only a preliminary, unconfirmed 1300 analytical test result and should not be used for non-medical 1400 purposes. Clinical consideration and professional judgment should 1500 be applied to any positive drug screen result due to possible 1600 interfering substances. A more specific alternate chemical method 1700 must be used in order to obtain a confirmed analytical result.  1800 Gas chromato graphy / mass spectrometry (GC/MS) is the preferred 1900 confirmatory method.     Vitals: Blood pressure 145/79, pulse 84, temperature 98.9 F (37.2 C), temperature source Oral, resp. rate 20, height _0  (1.6 m), weight 63.504 kg (140 lb), SpO2 94 %.  Risk to Self: Suicidal Ideation: No Suicidal Intent: No Is patient at risk for suicide?: No Suicidal Plan?: No Access to Means: No What has been your use of drugs/alcohol within the last 12 months?: None How many  times?: 0 Other Self Harm Risks: 0 Triggers for Past Attempts: None known Intentional Self Injurious Behavior: None Risk to Others: Homicidal Ideation: No Thoughts of Harm to Others: No Current Homicidal Intent: No Current Homicidal Plan: No Access to Homicidal Means: No Identified Victim: None History of harm to others?: No Assessment of Violence: None Noted Violent Behavior Description: Pt becomes physically aggressive towards daughter. Does patient have access to weapons?: No Criminal Charges Pending?: No Does patient have a court date: No Prior Inpatient Therapy: Prior Inpatient Therapy: Yes Prior Therapy Facilty/Provider(s): [facility] Reason for Treatment: Altered mental state Prior Outpatient Therapy: Prior Outpatient Therapy: No  Current Facility-Administered Medications  Medication Dose Route Frequency Provider Last Rate Last Dose  . acetaminophen (TYLENOL) tablet 650 mg  650 mg Oral Q6H PRN Orbie Pyo, MD      . calcium carbonate (TUMS - dosed in mg elemental calcium) chewable tablet 200 mg of elemental calcium  1 tablet Oral PRN Orbie Pyo, MD      . escitalopram Loma Sousa) tablet 5 mg  5 mg Oral TID Orbie Pyo, MD   5 mg at 07/21/15 0907  . LORazepam (ATIVAN) tablet 1 mg  1 mg Oral Q4H PRN Orbie Pyo, MD   1 mg at 07/21/15 0518  . mirtazapine (REMERON) tablet 7.5 mg  7.5 mg Oral QHS Orbie Pyo, MD   7.5 mg at 07/20/15 2251  . multivitamin with minerals tablet 1 tablet  1 tablet Oral Once Orbie Pyo, MD      . QUEtiapine (SEROQUEL) tablet 25 mg  25 mg Oral TID Gonzella Lex, MD      . traZODone (DESYREL) tablet 50 mg  50 mg Oral QHS PRN Orbie Pyo, MD      . vitamin B-12 (CYANOCOBALAMIN) tablet 1,000 mcg  1,000 mcg Oral Daily Orbie Pyo, MD   1,000 mcg at 07/21/15 5784   Current Outpatient Prescriptions  Medication Sig Dispense Refill  . acetaminophen (TYLENOL) 325 MG  tablet Take 650 mg by mouth every 6 (six) hours as needed for mild pain or moderate pain.    . calcium carbonate (TUMS - DOSED IN MG ELEMENTAL CALCIUM) 500 MG chewable tablet Chew 2 tablets by mouth as needed for heartburn.    . escitalopram (LEXAPRO) 5 MG tablet Take 5 mg by mouth 3 (three) times daily.    . folic acid-pyridoxine-cyancobalamin (FOLTX) 2.5-25-2 MG TABS Take 1 tablet by mouth daily.    . Melatonin-Pyridoxine (MELATIN PO) Take 1 mg by mouth at bedtime.    . Multiple Vitamin (  MULTIVITAMIN) tablet Take 1 tablet by mouth daily.    . traZODone (DESYREL) 50 MG tablet Take 50 mg by mouth at bedtime.    . vitamin B-12 (CYANOCOBALAMIN) 1000 MCG tablet Take 1,000 mcg by mouth daily.    . cephALEXin (KEFLEX) 500 MG capsule Take 1 capsule (500 mg total) by mouth 2 (two) times daily. (Patient taking differently: Take 500 mg by mouth 2 (two) times daily. For 7 days) 14 capsule 0  . mirtazapine (REMERON) 7.5 MG tablet Take 7.5 mg by mouth at bedtime.    . risperiDONE (RISPERDAL) 0.5 MG tablet Take 1 tablet (0.5 mg total) by mouth 2 (two) times daily as needed (agitation). 60 tablet 0    Musculoskeletal: Strength & Muscle Tone: within normal limits Gait & Station: shuffle Patient leans: N/A  Psychiatric Specialty Exam: Physical Exam  Constitutional: She appears well-developed and well-nourished. She appears distressed.  HENT:  Head: Normocephalic and atraumatic.  Eyes: Conjunctivae are normal. Pupils are equal, round, and reactive to light.  Neck: Normal range of motion.  Cardiovascular: Normal heart sounds.   Respiratory: Effort normal.  GI: Soft.  Musculoskeletal: Normal range of motion.  Neurological: She is alert.  Skin: Skin is warm and dry.  Psychiatric: Her mood appears anxious. Her speech is tangential. She is agitated. Thought content is paranoid. Cognition and memory are impaired. She expresses impulsivity. She exhibits abnormal recent memory and abnormal remote memory.     Review of Systems  Constitutional: Negative.   HENT: Negative.   Eyes: Negative.   Respiratory: Negative.   Cardiovascular: Negative.   Gastrointestinal: Negative.   Musculoskeletal: Negative.   Skin: Negative.   Neurological: Negative.   Psychiatric/Behavioral: Negative for depression, suicidal ideas, hallucinations, memory loss and substance abuse. The patient is nervous/anxious. The patient does not have insomnia.     Blood pressure 145/79, pulse 84, temperature 98.9 F (37.2 C), temperature source Oral, resp. rate 20, height _0  (1.6 m), weight 63.504 kg (140 lb), SpO2 94 %.Body mass index is 24.81 kg/(m^2).  General Appearance: Disheveled and Guarded  Eye Contact::  Fair  Speech:  Garbled and Slow  Volume:  Increased  Mood:  Anxious  Affect:  Labile  Thought Process:  Disorganized and Loose  Orientation:  Other:  Patient knew that she was in a hospital but not which one. She did not know the correct state and had no idea about time  Thought Content:  Paranoid Ideation and Rumination  Suicidal Thoughts:  No  Homicidal Thoughts:  No  Memory:  Immediate;   Poor Recent;   Poor Remote;   Poor  Judgement:  Impaired  Insight:  Lacking  Psychomotor Activity:  Increased  Concentration:  Poor  Recall:  Poor  Fund of Knowledge:Poor  Language: Fair  Akathisia:  No  Handed:  Right  AIMS (if indicated):     Assets:  Physical Health Social Support  ADL's:  Intact  Cognition: Impaired,  Moderate  Sleep:      Medical Decision Making: Review of Psycho-Social Stressors (1), Review or order clinical lab tests (1), Established Problem, Worsening (2), Review or order medicine tests (1), Review of Medication Regimen & Side Effects (2) and Review of New Medication or Change in Dosage (2)  Treatment Plan Summary: Medication management and Plan This is a patient with dementia probably largely from Alzheimer's disease with behavior disturbance. She has been unmanageable at the home of  her daughter because of her agitation and labile mood but the daughter  has been reticent to acknowledge this. Patient has once again had a worsening of her behavior. I spoke to the daughter by phone. I agreed that we will try referring her to a geriatric psychiatry facility again although I'm not all that optimistic about whether they will accept her. In the meantime I'm going to change her Risperdal to Seroquel which may be a little more sedating and less likely to cause akathisia. Continue other medicine. Case discussed with psychiatry team. Continue IVC.  Plan:  Recommend psychiatric Inpatient admission when medically cleared. Disposition: Some changes in her medicine and continue IVC with referral to geriatric psychiatry  Alethia Berthold 07/21/2015 12:55 PM

## 2015-07-21 NOTE — ED Notes (Signed)
BEHAVIORAL HEALTH ROUNDING Patient sleeping: No. Patient alert and oriented: no Behavior appropriate: Yes.   Nutrition and fluids offered: Yes  Toileting and hygiene offered: Yes  Sitter present: yes Law enforcement present: Yes   

## 2015-07-21 NOTE — ED Notes (Signed)
Pt back in bed, resting quietly, pt ate 100% of lunch

## 2015-07-21 NOTE — ED Notes (Signed)
Pt given breakfast tray

## 2015-07-21 NOTE — ED Notes (Signed)
BEHAVIORAL HEALTH ROUNDING Patient sleeping: Yes.   Patient alert and oriented: no Behavior appropriate: Yes.   Nutrition and fluids offered: Yes  Toileting and hygiene offered: Yes  Sitter present: yes Law enforcement present: Yes   

## 2015-07-21 NOTE — ED Notes (Signed)
ED BHU PLACEMENT JUSTIFICATION Is the patient under IVC or is there intent for IVC: Yes.    Is IVC current? Yes Is the patient medically cleared: Yes.   Is there vacancy in the ED BHU: Yes.   Is the population mix appropriate for patient: No, pt with baseline dementia, incontinent   Is the patient awaiting placement in inpatient or outpatient setting: Yes.   Has the patient had a psychiatric consult: No.   Survey of unit performed for contraband, proper placement and condition of furniture, tampering with fixtures in bathroom, shower, and each patient room: Yes.  ; Findings: none APPEARANCE/BEHAVIOR Cooperative,calm NEURO ASSESSMENT Orientation: oriented to self only Hallucinations: none noted at this time Speech: Normal Gait: normal RESPIRATORY ASSESSMENT Breathing Pattern-regular, no respiratory distress noted CARDIOVASCULAR ASSESSMENT Skin color appropriate for age and race GASTROINTESTINAL ASSESSMENT no GI distress noted EXTREMITIES Moves all extremities, no distress noted PLAN OF CARE Provide calm/safe environment. Vital signs assessed twice daily. ED BHU Assessment once each 12-hour shift. Collaborate with intake RN daily or as condition indicates. Assure the ED provider has rounded once each shift. Provide and encourage hygiene. Provide redirection as needed. Assess for escalating behavior; address immediately and inform ED provider.  Assess family dynamic and appropriateness for visitation as needed: Yes.  Educate the patient/family about BHU procedures/visitation: Yes.

## 2015-07-21 NOTE — ED Notes (Signed)
Spoke to daughter on phone, daughter tearful and upset stating "I just dont want yall to think I treat her bad, I am good to her, she dosent know who I am", daughter comforted and explained situation to her

## 2015-07-21 NOTE — ED Notes (Signed)
BEHAVIORAL HEALTH ROUNDING Patient sleeping: Yes.   Patient alert and oriented: yes Behavior appropriate: Yes.  ;  Nutrition and fluids offered: Yes  Toileting and hygiene offered: Yes  Sitter present: yes Law enforcement present: Yes   ENVIRONMENTAL ASSESSMENT Potentially harmful objects out of patient reach: Yes.   Personal belongings secured: Yes.   Patient dressed in hospital provided attire only: Yes.   Plastic bags out of patient reach: Yes.   Patient care equipment (cords, cables, call bells, lines, and drains) shortened, removed, or accounted for: Yes.   Equipment and supplies removed from bottom of stretcher: Yes.   Potentially toxic materials out of patient reach: Yes.   Sharps container removed or out of patient reach: Yes.     

## 2015-07-21 NOTE — ED Notes (Signed)
BEHAVIORAL HEALTH ROUNDING Patient sleeping: No. Patient alert and oriented: no Behavior appropriate: Yes.   Nutrition and fluids offered: Yes  Toileting and hygiene offered: Yes  Sitter present: yes Law enforcement present: Yes   Pt ate 100% of breakfast

## 2015-07-22 DIAGNOSIS — F0391 Unspecified dementia with behavioral disturbance: Secondary | ICD-10-CM | POA: Diagnosis not present

## 2015-07-22 NOTE — ED Notes (Signed)
Pt sleeping.  Breathing even and unlabored.  NAD. 

## 2015-07-22 NOTE — ED Notes (Signed)

## 2015-07-22 NOTE — ED Notes (Signed)
BEHAVIORAL HEALTH ROUNDING  Patient sleeping: Yes.  Patient alert and oriented: Yes Behavior appropriate: Yes. ; If no, describe:  Nutrition and fluids offered: Yes Toileting and hygiene offered: Yes Sitter present: yes  Law enforcement present: Yes   

## 2015-07-22 NOTE — ED Notes (Signed)
BEHAVIORAL HEALTH ROUNDING Patient sleeping: No. Patient alert and oriented: no Behavior appropriate: Yes.   Nutrition and fluids offered: Yes  Toileting and hygiene offered: Yes  Sitter present: yes Law enforcement present: Yes   

## 2015-07-22 NOTE — ED Notes (Signed)
Breakfast served

## 2015-07-22 NOTE — ED Notes (Signed)
Burna Mortimer [phone] - personal cell) with [facility] called to see what decisions had been made re pt placement/disposition.  [facility] has provided in-home help to pt since 07/04/15.  Burna Mortimer requests that we call [facility] when a decision has been made.  Call organization phone [phone] and ask for RN.

## 2015-07-22 NOTE — Progress Notes (Signed)
LCSW met with patient and her daughter. Patient appeared to be calm and oriented to time and place and her daughter. LCSW discussed the resources available to patient and daughter for extra in home support. It was explained that patient is connected to [facility] ( RN Vickie). Staff consulted to do another consult so patient could possible discharge soon. Dr was paged awaiting a call back.

## 2015-07-22 NOTE — ED Notes (Addendum)
BEHAVIORAL HEALTH ROUNDING  Patient sleeping: Yes.  Patient alert and oriented: no  Behavior appropriate: Yes. ; If no, describe:  Nutrition and fluids offered: No  Toileting and hygiene offered: No  Sitter present: no  Law enforcement present: Yes   

## 2015-07-22 NOTE — ED Notes (Signed)
Pt OOB to bathroom.  One person assist. 

## 2015-07-22 NOTE — ED Notes (Signed)
Pt OOB to bathroom.  One person assist.

## 2015-07-22 NOTE — ED Notes (Signed)
BEHAVIORAL HEALTH ROUNDING Patient sleeping: Yes.   Patient alert and oriented: not applicable Behavior appropriate: Yes.  ; If no, describe:  Nutrition and fluids offered: No Toileting and hygiene offered: No Sitter present: not applicable Law enforcement present: Yes  

## 2015-07-22 NOTE — ED Notes (Signed)
Pt has dimentia.

## 2015-07-22 NOTE — ED Notes (Signed)
Patient is resting comfortably. 

## 2015-07-22 NOTE — ED Notes (Signed)
Dinner served

## 2015-07-22 NOTE — ED Notes (Signed)
Lunch served

## 2015-07-22 NOTE — Progress Notes (Signed)
LCSW called IN TAKE deptartments and there are no beds available at these locations [facility] [address] [place],  [facility], [place], [place] [facility], [facility] [place]  [place] and [place]  Left message  awaiting a call back  Will re-sent referral to [facility] and [place].

## 2015-07-22 NOTE — ED Provider Notes (Signed)
-----------------------------------------   7:52 AM on 07/22/2015 -----------------------------------------   BP 147/89 mmHg  Pulse 100  Temp(Src) 98.1 F (36.7 C) (Oral)  Resp 18  Ht  (1.6 m)  Wt 140 lb (63.504 kg)  BMI 24.81 kg/m2  SpO2 96%  No acute events since last update.  Acting appropriately.  Disposition is pending per Psychiatry/Behavioral Medicine team recommendations.     Phineas Semen, MD 07/22/15 [phone]

## 2015-07-22 NOTE — ED Notes (Signed)
BEHAVIORAL HEALTH ROUNDING Patient sleeping: No. Patient alert and oriented: alert; not oriented Behavior appropriate: Yes.  ; If no, describe:  Nutrition and fluids offered: Yes  Toileting and hygiene offered: Yes  Sitter present: not applicable Law enforcement present: Yes  

## 2015-07-22 NOTE — ED Notes (Signed)
Pt on the phone with Diane, her daughter.

## 2015-07-22 NOTE — ED Notes (Signed)
BEHAVIORAL HEALTH ROUNDING Patient sleeping: No. Patient alert and oriented: alert; not oriented Behavior appropriate: Yes.  ; If no, describe:  Nutrition and fluids offered: Yes  Toileting and hygiene offered: Yes  Sitter present: yes Law enforcement present: Yes  

## 2015-07-22 NOTE — ED Notes (Signed)
Pt's daughter is visiting.  Pt stood with her arms extended, teary, and cried "My baby! My baby!" as she embraced her daughter.

## 2015-07-22 NOTE — ED Notes (Signed)
BEHAVIORAL HEALTH ROUNDING  Patient sleeping: Yes Patient alert and oriented: Sleeping  Behavior appropriate: Sleeping   Nutrition and fluids offered: Sleeping  Toileting and hygiene offered: Yes  Sitter present: yes  Law enforcement present: Yes

## 2015-07-22 NOTE — ED Notes (Addendum)
Family at bedside. Pt and daughter laughing.

## 2015-07-22 NOTE — Progress Notes (Signed)
LCSW met with patients daughter and provided additional senior resource/ nursing home brochures and lists. Patient was placed back in bed as she was tired. Nurse was informed Dr on call paged. Daughter decided to leave room/hospital as patient was becoming a bit restless. Pt is now resting. Daughter expects a possible discharge home tomorrow after psychiatrist  Consults.

## 2015-07-22 NOTE — ED Notes (Signed)
BEHAVIORAL HEALTH ROUNDING Patient sleeping: No. Patient alert and oriented: no, pt reoriented to place and time. Behavior appropriate: Yes.  ; If no, describe:  Nutrition and fluids offered: Yes  Toileting and hygiene offered: Yes  Sitter present: no Law enforcement present: Yes

## 2015-07-22 NOTE — ED Notes (Signed)
Pt sitting in wheelchair; facing hallway.  Pt speaks pleasantly with staff.  TV on music/scenic channel.

## 2015-07-23 DIAGNOSIS — F0391 Unspecified dementia with behavioral disturbance: Secondary | ICD-10-CM | POA: Diagnosis not present

## 2015-07-23 LAB — URINALYSIS COMPLETE WITH MICROSCOPIC (ARMC ONLY)
Bacteria, UA: NONE SEEN
Bilirubin Urine: NEGATIVE
Glucose, UA: NEGATIVE mg/dL
HGB URINE DIPSTICK: NEGATIVE
Ketones, ur: NEGATIVE mg/dL
Leukocytes, UA: NEGATIVE
Nitrite: NEGATIVE
Protein, ur: NEGATIVE mg/dL
Specific Gravity, Urine: 1.014 (ref 1.005–1.030)
pH: 6 (ref 5.0–8.0)

## 2015-07-23 NOTE — ED Notes (Signed)
Pt OOB to recliner; facing doorway; TV on; pt interacts with staff in hallway.

## 2015-07-23 NOTE — ED Notes (Signed)
ED BHU PLACEMENT JUSTIFICATION  Is the patient under IVC or is there intent for IVC: No Is the patient medically cleared: Yes.  Is there vacancy in the ED BHU: Yes.  Is the population mix appropriate for patient: No Pt wanders and is not appropriate for BHU. Is the patient awaiting placement in inpatient or outpatient setting: Yes.  Has the patient had a psychiatric consult: Yes.  Survey of unit performed for contraband, proper placement and condition of furniture, tampering with fixtures in bathroom, shower, and each patient room: Yes. ; Findings: All clear  APPEARANCE/BEHAVIOR  calm, cooperative and adequate rapport can be established  NEURO ASSESSMENT  Orientation: person, pt has dementia and is disoriented to place and time but reorients briefly.  Hallucinations: No.None noted (Hallucinations)  Speech: Normal  Gait: normal  RESPIRATORY ASSESSMENT  WNL  CARDIOVASCULAR ASSESSMENT  WNL  GASTROINTESTINAL ASSESSMENT  WNL  EXTREMITIES  WNL  PLAN OF CARE  Provide calm/safe environment. Vital signs assessed twice daily. ED BHU Assessment once each 12-hour shift. Collaborate with intake RN daily or as condition indicates. Assure the ED provider has rounded once each shift. Provide and encourage hygiene. Provide redirection as needed. Assess for escalating behavior; address immediately and inform ED provider.  Assess family dynamic and appropriateness for visitation as needed: Yes. ; If necessary, describe findings:  Educate the patient/family about BHU procedures/visitation: Yes. ; If necessary, describe findings: Pt is calm and cooperative at this time. Will continue to monitor.

## 2015-07-23 NOTE — ED Notes (Signed)
Pt helped back to bed by her daughter, Sedalia Muta.  Pt is confusing Diane, daughter, with Denette, home care giver.  Diane, daughter, commented on how confused her mother appears to be.

## 2015-07-23 NOTE — ED Notes (Signed)
Patient is resting comfortably. 

## 2015-07-23 NOTE — ED Provider Notes (Signed)
-----------------------------------------   6:58 AM on 07/23/2015 -----------------------------------------   Blood pressure 162/77, pulse 77, temperature 98.4 F (36.9 C), temperature source Oral, resp. rate 20, height  (1.6 m), weight 140 lb (63.504 kg), SpO2 96 %.  The patient had no acute events since last update.  Calm and cooperative at this time.  Disposition is pending per Psychiatry/Behavioral Medicine team recommendations.  The patient is to be reassessed in the morning.     Rebecka Apley, MD 07/23/15 [phone]

## 2015-07-23 NOTE — ED Notes (Signed)
Dinner served

## 2015-07-23 NOTE — ED Notes (Signed)
Pt is very confused about where she is, why, and where she is going when she leaves.  RN attempted to reassure pt. Reminded pt of daughter Diane's visit yesterday, which pt did not remember.  Pt asked RN what they had said and RN told pt that they had hugged and laughed a lot.  Pt seemed less concerned after conversation.

## 2015-07-23 NOTE — ED Notes (Signed)

## 2015-07-23 NOTE — ED Notes (Signed)

## 2015-07-23 NOTE — ED Notes (Signed)
Pt looked uncomfortable, so RN lowered the head of the bed and awakened the pt briefly.  Pt turned onto her back, blanket was repositioned and pt went back to sleep.

## 2015-07-23 NOTE — ED Notes (Signed)
Pt sleeping.  Breathing even and unlabored.  NAD noted. 

## 2015-07-23 NOTE — ED Notes (Signed)
Per promise, RN has called pt's daughter and they are now having a conversation.

## 2015-07-23 NOTE — ED Notes (Signed)
BEHAVIORAL HEALTH ROUNDING Patient sleeping: No. Patient alert and oriented: alert, not oriented Behavior appropriate: Yes.  ; If no, describe:  Nutrition and fluids offered: Yes  Toileting and hygiene offered: Yes  Sitter present: not applicable Law enforcement present: Yes  

## 2015-07-23 NOTE — ED Notes (Signed)
Pt OOB to recliner; facing doorway to room.  TV on.

## 2015-07-23 NOTE — ED Notes (Signed)
BEHAVIORAL HEALTH ROUNDING Patient sleeping: No. Patient alert and oriented: alert; not oriented Behavior appropriate: Yes.  ; If no, describe:  Nutrition and fluids offered: Yes  Toileting and hygiene offered: Yes  Sitter present: not applicable Law enforcement present: Yes  

## 2015-07-23 NOTE — ED Notes (Signed)
Pt remains very confused.  She is now concerned about her shoes.  She can't find them, and she doesn't understand what staff members tell her and she insists that someone told her to get their shoes and she goes on and on.  Eventually, with fairly constant staff interaction, she can be redirected to some degree but she will return to her area of concern.  RN promised to call her daughter after five o'clock when telephone hours begin.Marland Kitchen

## 2015-07-23 NOTE — ED Notes (Signed)
Breakfast served

## 2015-07-23 NOTE — Progress Notes (Signed)
LCSW met with patient briefly to check in with her. She was unable to remember our visit, so I re-introduced myself and wished her well. She was not able to tell me if her daughter came today.LCSW stayed with patient as nurse gave her a drink and some medication. She appeared to be slightly agitated but settled down as her hand was held.

## 2015-07-23 NOTE — ED Notes (Addendum)
BEHAVIORAL HEALTH ROUNDING  Patient sleeping: No.  Patient alert and oriented: yes -oriented to self only Behavior appropriate: Yes. ; If no, describe:  Nutrition and fluids offered: Yes  Toileting and hygiene offered: Yes  Sitter present: not applicable  Law enforcement present: Yes ODS

## 2015-07-23 NOTE — ED Notes (Signed)
BEHAVIORAL HEALTH ROUNDING Patient sleeping: Yes.   Patient alert and oriented: not applicable Behavior appropriate: Yes.  ; If no, describe:  Nutrition and fluids offered: No Toileting and hygiene offered: No Sitter present: not applicable Law enforcement present: Yes  

## 2015-07-23 NOTE — ED Notes (Signed)
Pt eating lunch; sitting in the recliner.

## 2015-07-23 NOTE — ED Notes (Signed)
Diane, pt's daughter, phone number - [phone]

## 2015-07-23 NOTE — ED Notes (Signed)
BEHAVIORAL HEALTH ROUNDING Patient sleeping: Yes.   Patient alert and oriented: sleeping Behavior appropriate: Yes.  ; If no, describe:  Nutrition and fluids offered: No Toileting and hygiene offered: No Sitter present: no Law enforcement present: Yes  

## 2015-07-23 NOTE — ED Notes (Signed)
Pt was in the recliner, remained agitated about shoes.  Her daughter Ruth Juarez told us that, often, she would leave their home at night to go buy her mother new shoes because pt/mother would become so agitated about shoes.  Pt was helped to her bed, remained there a few minutes, and now she is back in the recliner.

## 2015-07-23 NOTE — ED Notes (Signed)
Pt OOB to bathroom

## 2015-07-23 NOTE — Progress Notes (Signed)
This Clinical research associate consulted with Dr. Jenne Campus about the patient's case and it is recommended to continue with the current course of treatment and placement.  Patient will be re-evaluated in the AM.  Attempted to secure placement at the following facilities:  [facility]- dementia diagnosis is an exclusionary diagnosis [facility]- dementia diagnosis is an exclusionary diagnosis [place]- refaxed [facility]- no beds [facility]- no beds    Maryelizabeth Rowan, MSW, LCSW, [place] Clinical Social Worker [phone]

## 2015-07-23 NOTE — ED Notes (Signed)
Pt's daughter is visiting.  Pt's daughter reveals that her own daughter was bipolar and committed suicide by hanging several years ago.  Pt's daughter is still dealing with the death of her own daughter and did not understand what the care of her mother would entail.  Although she is committed to caring for her mother, she acknowledges that it is somewhat overwhelming.

## 2015-07-24 ENCOUNTER — Emergency Department: Payer: Medicare Other

## 2015-07-24 DIAGNOSIS — G308 Other Alzheimer's disease: Secondary | ICD-10-CM | POA: Diagnosis not present

## 2015-07-24 DIAGNOSIS — F0391 Unspecified dementia with behavioral disturbance: Secondary | ICD-10-CM | POA: Diagnosis not present

## 2015-07-24 LAB — URINE CULTURE

## 2015-07-24 NOTE — BHH Counselor (Signed)
[facility] [place], [phone]) called to request Pt chest x-rays. Pt is accepted pending x-ray results. Fax number is [phone].   TTS notified Hydrologist Dewayne Hatch).

## 2015-07-24 NOTE — Progress Notes (Signed)
LCSW in consultation with TTS stated that we have a geriatric psych hospital once they have a bed in [place]

## 2015-07-24 NOTE — Progress Notes (Signed)
LCSW met with daughter at 12:12:30pm today. It was explained that  Her Mom would be supported best in a Geriatric Psychiatric unit for short term until patient is further stabilized. This worker provided information and resources and talked at length about the benefit of counseling and support  for herself.   Patients daughter was supported and upset that patient was to go to another unit, she is concerned with quality of care for mom LCSW reminded her that daily visits are beneficial   Patient will be going to [place].

## 2015-07-24 NOTE — ED Notes (Signed)
BEHAVIORAL HEALTH ROUNDING Patient sleeping: Yes.   Patient alert and oriented: yes Behavior appropriate: Yes.  ; If no, describe:  Nutrition and fluids offered: Yes  Toileting and hygiene offered: Yes  Sitter present: no Law enforcement present: Yes  

## 2015-07-24 NOTE — ED Provider Notes (Signed)
-----------------------------------------   6:44 AM on 07/24/2015 -----------------------------------------   Blood pressure 140/68, pulse 69, temperature 98.3 F (36.8 C), temperature source Oral, resp. rate 18, height  (1.6 m), weight 140 lb (63.504 kg), SpO2 95 %.  The patient had no acute events since last update.  Calm and cooperative at this time.  Disposition is pending per Psychiatry/Behavioral Medicine team recommendations.     Leona Carry, MD 07/24/15 [phone]

## 2015-07-24 NOTE — ED Notes (Signed)

## 2015-07-24 NOTE — ED Notes (Signed)
Patient transported to X-ray 

## 2015-07-24 NOTE — ED Notes (Signed)
BEHAVIORAL HEALTH ROUNDING Patient sleeping: No. Patient alert and oriented: yes Behavior appropriate: Yes.  ; If no, describe:  Nutrition and fluids offered: Yes  Toileting and hygiene offered: Yes  Sitter present: no Law enforcement present: Yes  

## 2015-07-24 NOTE — ED Notes (Signed)
BEHAVIORAL HEALTH ROUNDING Patient sleeping: Yes.   Patient alert and oriented: not applicable SLEEPING Behavior appropriate: Yes.  ; If no, describe: SLEEPING Nutrition and fluids offered: No SLEEPING Toileting and hygiene offered: NoSLEEPING Sitter present: not applicable Law enforcement present: Yes ODS 

## 2015-07-24 NOTE — ED Notes (Signed)
BEHAVIORAL HEALTH ROUNDING Patient sleeping: Yes.   Patient alert and oriented: no Behavior appropriate: Yes.  ; If no, describe:  Nutrition and fluids offered: Yes  Toileting and hygiene offered: Yes  Sitter present: no Law enforcement present: Yes  

## 2015-07-24 NOTE — ED Notes (Signed)
Patient assigned to appropriate care area. Patient oriented to unit/care area: Informed that, for their safety, care areas are designed for safety and monitored by security cameras at all times; and visiting hours explained to patient. Patient verbalizes understanding, and verbal contract for safety obtained. 

## 2015-07-24 NOTE — Progress Notes (Signed)
Client has been accepted to [facility]-- Geriatric Unit; per Dr. Lowanda Foster to Rm: [phone]; and to call report to---[phone]; per [facility], RN--intake; can transport; as a bed is available.

## 2015-07-24 NOTE — Progress Notes (Signed)
ED visit made to follow up on current [facility] patient. Patient seen, daughter Sedalia Muta present. Ms. Stokely was calm and interactive with Clinical research associate and her daughter. Per Sedalia Muta, staff RN Olegario Messier and chart note review plan is for discharge to [facility] hospital, awaiting bed availability. Diane expressed her frustration with her siblings and her lack of support from them. Emotional support offered. [facility] team updated. Will continue to follow through final disposition. Dayna Barker RN, BSN, [facility], [facility] [phone] c

## 2015-07-24 NOTE — Consult Note (Signed)
[facility] Face-to-Face Psychiatry Consult   Reason for Consult:  Consult for this 81 year old woman with dementia brought in under involuntary commitment by her daughter Referring Physician:  Mayford Knife Patient Identification: Ruth Juarez MRN:  [id number] Principal Diagnosis: Alzheimer's dementia with behavioral disturbance Diagnosis:   Patient Active Problem List   Diagnosis Date Noted  . Alzheimer's dementia with behavioral disturbance [G30.8] 07/03/2015  . Urinary tract infection [N39.0] 07/03/2015    Total Time spent with patient: 1 hour  Subjective:   Ruth Juarez is a 81 y.o. female patient admitted with "I don't know why she did this" . patient is not a good historian to say the least. Most of the more meaningful history comes from speaking with the daughter  HPI:  Information from the patient and the patient's daughter. Patient's daughter reports that she took her mother out of [facility] after just about 4 days of her last admission. Mother is been back home for over a week. She's had a few episodes of getting agitated but yesterday was the worst. She got herself gradually worked up over various things getting more and more confused. She was insisting on leaving the house. Getting aggressive and threatening her daughter. Clearly very demented with no understanding of what's going on. Patient is fixed on the paranoid idea that her daughter has stolen her money and will not understand the actual chain of events that are happening. There has been no known or change to medicine. She has not had any new injury or new medical problem. No other specific new stress identified. She is currently taking low dose mirtazapine and Lexapro as well as Risperdal. Daughter reports that she's been compliant with her medicine.  Patient has been to our emergency room 3 times now. She is demented with behavior disturbance. It's not clear that there was any prior mental health treatment before the onset  of her dementia. No history of suicide attempts. She has a history of some aggression in the context of her dementia. The daughter describes her mother as having "always been a mean person."  Medical history: She had a urinary tract infection last time she was here but that doesn't seem to be an issue at this time. Anemia. No other significant ongoing medical problems known. She does have a couple of high blood sugars but no known diagnosis of diabetes.  Social history: Patient has been living with her daughter down here in [place] for about a month now. Prior to that she had been living independently in [place]. The state of [place] had condemned her house and was going to take custody of her when the daughter insisted on bringing the patient down here to [place]. It's been a struggle to take care of her ever since.  Family history: None known  Substance abuse history: Nonidentified  Update as of Monday the eighth. This patient has continued to be very demented and confused. Intermittently can get agitated but has not been violent. Tolerating medicines well. Daughter is still very interested in having her eventually come back to the daughter's house to live. Patient will be transferred to a geriatric psychiatry unit for further evaluation and stabilization. HPI Elements:   Quality:  Confusion and dementia agitation. Severity:  Moderate at least with troubling aggression. Timing:  Symptoms seem to of been present possibly for more than a year but we've been following him for about a month. Duration:  She is still agitated today. Context:  Recent dislocation from home.  Past Medical History:  Past Medical History  Diagnosis Date  . Dementia     Past Surgical History  Procedure Laterality Date  . Abdominal hysterectomy    . Tonsillectomy     Family History: No family history on file. Social History:  History  Alcohol Use No    Comment: Patient denies       History  Drug Use No    Comment: Patient denies    History   Social History  . Marital Status: Widowed    Spouse Name: N/A  . Number of Children: N/A  . Years of Education: N/A   Social History Main Topics  . Smoking status: Never Smoker   . Smokeless tobacco: Not on file  . Alcohol Use: No     Comment: Patient denies   . Drug Use: No     Comment: Patient denies  . Sexual Activity: No   Other Topics Concern  . None   Social History Narrative   Additional Social History:    History of alcohol / drug use?: No history of alcohol / drug abuse                     Allergies:  No Known Allergies  Labs:  Results for orders placed or performed during the hospital encounter of 07/20/15 (from the past 48 hour(s))  Urinalysis complete, with microscopic     Status: Abnormal   Collection Time: 07/23/15  9:58 AM  Result Value Ref Range   Color, Urine YELLOW (A) YELLOW   APPearance CLEAR (A) CLEAR   Glucose, UA NEGATIVE NEGATIVE mg/dL   Bilirubin Urine NEGATIVE NEGATIVE   Ketones, ur NEGATIVE NEGATIVE mg/dL   Specific Gravity, Urine 1.014 1.005 - 1.030   Hgb urine dipstick NEGATIVE NEGATIVE   pH 6.0 5.0 - 8.0   Protein, ur NEGATIVE NEGATIVE mg/dL   Nitrite NEGATIVE NEGATIVE   Leukocytes, UA NEGATIVE NEGATIVE   RBC / HPF 0-5 0 - 5 RBC/hpf   WBC, UA 0-5 0 - 5 WBC/hpf   Bacteria, UA NONE SEEN NONE SEEN   Squamous Epithelial / LPF 0-5 (A) NONE SEEN   Mucous PRESENT   Urine culture     Status: None   Collection Time: 07/23/15  9:58 AM  Result Value Ref Range   Specimen Description URINE, CLEAN CATCH    Special Requests NONE    Culture MULTIPLE SPECIES PRESENT, SUGGEST RECOLLECTION    Report Status 07/24/2015 FINAL     Vitals: Blood pressure 139/117, pulse 99, temperature 98.7 F (37.1 C), temperature source Oral, resp. rate 18, height  (1.6 m), weight 63.504 kg (140 lb), SpO2 97 %.  Risk to Self: Suicidal Ideation: No Suicidal Intent: No Is patient at  risk for suicide?: No Suicidal Plan?: No Access to Means: No What has been your use of drugs/alcohol within the last 12 months?: None How many times?: 0 Other Self Harm Risks: 0 Triggers for Past Attempts: None known Intentional Self Injurious Behavior: None Risk to Others: Homicidal Ideation: No Thoughts of Harm to Others: No Current Homicidal Intent: No Current Homicidal Plan: No Access to Homicidal Means: No Identified Victim: None History of harm to others?: No Assessment of Violence: None Noted Violent Behavior Description: Pt becomes physically aggressive towards daughter. Does patient have access to weapons?: No Criminal Charges Pending?: No Does patient have a court date: No Prior Inpatient Therapy: Prior Inpatient Therapy: Yes Prior Therapy Facilty/Provider(s): [facility] Reason for Treatment: Altered mental  state Prior Outpatient Therapy: Prior Outpatient Therapy: No  Current Facility-Administered Medications  Medication Dose Route Frequency Provider Last Rate Last Dose  . acetaminophen (TYLENOL) tablet 650 mg  650 mg Oral Q6H PRN Myrna Blazer, MD   650 mg at 07/22/15 1936  . calcium carbonate (TUMS - dosed in mg elemental calcium) chewable tablet 200 mg of elemental calcium  1 tablet Oral PRN Myrna Blazer, MD      . escitalopram Judye Bos) tablet 5 mg  5 mg Oral Daily Myrna Blazer, MD   5 mg at 07/24/15 1032  . LORazepam (ATIVAN) tablet 1 mg  1 mg Oral Q4H PRN Myrna Blazer, MD   1 mg at 07/22/15 1935  . mirtazapine (REMERON) tablet 7.5 mg  7.5 mg Oral QHS Myrna Blazer, MD   7.5 mg at 07/23/15 2241  . QUEtiapine (SEROQUEL) tablet 25 mg  25 mg Oral TID Audery Amel, MD   25 mg at 07/24/15 1033  . traZODone (DESYREL) tablet 50 mg  50 mg Oral QHS PRN Myrna Blazer, MD   50 mg at 07/22/15 2145  . vitamin B-12 (CYANOCOBALAMIN) tablet 1,000 mcg  1,000 mcg Oral Daily Myrna Blazer, MD   1,000 mcg at 07/24/15  1033   Current Outpatient Prescriptions  Medication Sig Dispense Refill  . acetaminophen (TYLENOL) 325 MG tablet Take 650 mg by mouth every 6 (six) hours as needed for mild pain or moderate pain.    . calcium carbonate (TUMS - DOSED IN MG ELEMENTAL CALCIUM) 500 MG chewable tablet Chew 2 tablets by mouth as needed for heartburn.    . escitalopram (LEXAPRO) 5 MG tablet Take 5 mg by mouth daily.     . folic acid-pyridoxine-cyancobalamin (FOLTX) 2.5-25-2 MG TABS Take 1 tablet by mouth daily.    . Melatonin-Pyridoxine (MELATIN PO) Take 1 mg by mouth at bedtime.    . Multiple Vitamin (MULTIVITAMIN) tablet Take 1 tablet by mouth daily.    . traZODone (DESYREL) 50 MG tablet Take 50 mg by mouth at bedtime. Pt takes 1/2-1 tablet every 4 hr prn for anxiety    . vitamin B-12 (CYANOCOBALAMIN) 1000 MCG tablet Take 1,000 mcg by mouth daily.    . cephALEXin (KEFLEX) 500 MG capsule Take 1 capsule (500 mg total) by mouth 2 (two) times daily. (Patient taking differently: Take 500 mg by mouth 2 (two) times daily. For 7 days) 14 capsule 0  . mirtazapine (REMERON) 7.5 MG tablet Take 7.5 mg by mouth at bedtime.    . risperiDONE (RISPERDAL) 0.5 MG tablet Take 1 tablet (0.5 mg total) by mouth 2 (two) times daily as needed (agitation). 60 tablet 0    Musculoskeletal: Strength & Muscle Tone: within normal limits Gait & Station: shuffle Patient leans: N/A  Psychiatric Specialty Exam: Physical Exam  Constitutional: She appears well-developed and well-nourished. She appears distressed.  HENT:  Head: Normocephalic and atraumatic.  Eyes: Conjunctivae are normal. Pupils are equal, round, and reactive to light.  Neck: Normal range of motion.  Cardiovascular: Normal heart sounds.   Respiratory: Effort normal.  GI: Soft.  Musculoskeletal: Normal range of motion.  Neurological: She is alert.  Skin: Skin is warm and dry.  Psychiatric: She has a normal mood and affect. Her behavior is normal. Thought content normal. Her  mood appears not anxious. Her speech is tangential. She is not agitated. Thought content is not paranoid. Cognition and memory are impaired. She expresses impulsivity. She exhibits abnormal recent memory  and abnormal remote memory.  Patient today was pleasant in interaction. Good eye contact. She has not been agitated threatening or hostile. By and large cooperative. She remains very cognitively impaired. Short-term memory is extremely limited.    Review of Systems  Constitutional: Negative.   HENT: Negative.   Eyes: Negative.   Respiratory: Negative.   Cardiovascular: Negative.   Gastrointestinal: Negative.   Musculoskeletal: Negative.   Skin: Negative.   Neurological: Negative.   Psychiatric/Behavioral: Negative for depression, suicidal ideas, hallucinations, memory loss and substance abuse. The patient is nervous/anxious. The patient does not have insomnia.     Blood pressure 139/117, pulse 99, temperature 98.7 F (37.1 C), temperature source Oral, resp. rate 18, height 5\' 3"  (1.6 m), weight 63.504 kg (140 lb), SpO2 97 %.Body mass index is 24.81 kg/(m^2).  General Appearance: Disheveled and Guarded  Eye Contact::  Fair  Speech:  Garbled and Slow  Volume:  Increased  Mood:  Anxious  Affect:  Labile  Thought Process:  Disorganized and Loose  Orientation:  Other:  Patient knew that she was in a hospital but not which one. She did not know the correct state and had no idea about time  Thought Content:  Paranoid Ideation and Rumination  Suicidal Thoughts:  No  Homicidal Thoughts:  No  Memory:  Immediate;   Poor Recent;   Poor Remote;   Poor  Judgement:  Impaired  Insight:  Lacking  Psychomotor Activity:  Increased  Concentration:  Poor  Recall:  Poor  Fund of Knowledge:Poor  Language: Fair  Akathisia:  No  Handed:  Right  AIMS (if indicated):     Assets:  Physical Health Social Support  ADL's:  Intact  Cognition: Impaired,  Moderate  Sleep:      Medical Decision Making:  Review of Psycho-Social Stressors (1), Review or order clinical lab tests (1), Established Problem, Worsening (2), Review or order medicine tests (1), Review of Medication Regimen & Side Effects (2) and Review of New Medication or Change in Dosage (2)  Treatment Plan Summary: Medication management and Plan Overall patient is much more calm. Tolerating medicine well. Vitals stable. Still very confused and argumentative with treatment plans but so far has not been hostile here. Still prone to agitation at times. Plan is for transfer to [facility] as soon as possible. Patient and family aware of plan.  Plan:  Recommend psychiatric Inpatient admission when medically cleared. Disposition: Some changes in her medicine and continue IVC with referral to geriatric psychiatry  Haide Klinker 07/24/2015 12:08 PM

## 2015-07-24 NOTE — ED Notes (Signed)
Psych md with pt, daughter here, info given to her about waiting on bed available at [facility]

## 2015-07-24 NOTE — ED Notes (Signed)
POA diane white took all of pt possessions, purse and clothes

## 2015-07-24 NOTE — ED Notes (Signed)
Per TTS Margaretmary Lombard pt is accepted to [facility] pending cxr results.

## 2015-07-24 NOTE — ED Notes (Signed)
Sheriff here.

## 2015-10-03 ENCOUNTER — Emergency Department: Payer: Medicare Other

## 2015-10-03 ENCOUNTER — Observation Stay: Payer: Medicare Other

## 2015-10-03 ENCOUNTER — Observation Stay
Admission: EM | Admit: 2015-10-03 | Discharge: 2015-10-04 | Disposition: A | Discharge status: Home / Self Care | Payer: Medicare Other | Attending: Internal Medicine | Admitting: Internal Medicine

## 2015-10-03 ENCOUNTER — Encounter: Payer: Self-pay | Admitting: Emergency Medicine

## 2015-10-03 DIAGNOSIS — T783XXA Angioneurotic edema, initial encounter: Secondary | ICD-10-CM | POA: Insufficient documentation

## 2015-10-03 DIAGNOSIS — Z9841 Cataract extraction status, right eye: Secondary | ICD-10-CM | POA: Insufficient documentation

## 2015-10-03 DIAGNOSIS — F329 Major depressive disorder, single episode, unspecified: Secondary | ICD-10-CM | POA: Insufficient documentation

## 2015-10-03 DIAGNOSIS — R29810 Facial weakness: Secondary | ICD-10-CM | POA: Diagnosis not present

## 2015-10-03 DIAGNOSIS — I639 Cerebral infarction, unspecified: Secondary | ICD-10-CM

## 2015-10-03 DIAGNOSIS — G459 Transient cerebral ischemic attack, unspecified: Secondary | ICD-10-CM | POA: Diagnosis present

## 2015-10-03 DIAGNOSIS — R040 Epistaxis: Secondary | ICD-10-CM | POA: Insufficient documentation

## 2015-10-03 DIAGNOSIS — Z9842 Cataract extraction status, left eye: Secondary | ICD-10-CM | POA: Diagnosis not present

## 2015-10-03 DIAGNOSIS — Z7982 Long term (current) use of aspirin: Secondary | ICD-10-CM | POA: Diagnosis not present

## 2015-10-03 DIAGNOSIS — Y939 Activity, unspecified: Secondary | ICD-10-CM | POA: Insufficient documentation

## 2015-10-03 DIAGNOSIS — G309 Alzheimer's disease, unspecified: Secondary | ICD-10-CM | POA: Insufficient documentation

## 2015-10-03 DIAGNOSIS — N390 Urinary tract infection, site not specified: Secondary | ICD-10-CM | POA: Diagnosis not present

## 2015-10-03 DIAGNOSIS — Z79899 Other long term (current) drug therapy: Secondary | ICD-10-CM | POA: Diagnosis not present

## 2015-10-03 DIAGNOSIS — X58XXXA Exposure to other specified factors, initial encounter: Secondary | ICD-10-CM | POA: Diagnosis not present

## 2015-10-03 DIAGNOSIS — H109 Unspecified conjunctivitis: Secondary | ICD-10-CM | POA: Diagnosis not present

## 2015-10-03 DIAGNOSIS — M4802 Spinal stenosis, cervical region: Secondary | ICD-10-CM | POA: Diagnosis not present

## 2015-10-03 DIAGNOSIS — F0281 Dementia in other diseases classified elsewhere with behavioral disturbance: Secondary | ICD-10-CM | POA: Insufficient documentation

## 2015-10-03 DIAGNOSIS — Z91030 Bee allergy status: Secondary | ICD-10-CM | POA: Diagnosis not present

## 2015-10-03 DIAGNOSIS — F419 Anxiety disorder, unspecified: Secondary | ICD-10-CM | POA: Diagnosis not present

## 2015-10-03 HISTORY — DX: Anxiety disorder, unspecified: F41.9

## 2015-10-03 LAB — COMPREHENSIVE METABOLIC PANEL
ALBUMIN: 3.7 g/dL (ref 3.5–5.0)
ALT: 11 U/L — ABNORMAL LOW (ref 14–54)
ANION GAP: 7 (ref 5–15)
AST: 19 U/L (ref 15–41)
Alkaline Phosphatase: 37 U/L — ABNORMAL LOW (ref 38–126)
BILIRUBIN TOTAL: 0.5 mg/dL (ref 0.3–1.2)
BUN: 29 mg/dL — ABNORMAL HIGH (ref 6–20)
CO2: 29 mmol/L (ref 22–32)
Calcium: 9.5 mg/dL (ref 8.9–10.3)
Chloride: 105 mmol/L (ref 101–111)
Creatinine, Ser: 0.74 mg/dL (ref 0.44–1.00)
GFR calc non Af Amer: >60 mL/min (ref >60)
GLUCOSE: 94 mg/dL (ref 65–99)
POTASSIUM: 3.9 mmol/L (ref 3.5–5.1)
SODIUM: 141 mmol/L (ref 135–145)
TOTAL PROTEIN: 7.3 g/dL (ref 6.5–8.1)

## 2015-10-03 LAB — CBC WITH DIFFERENTIAL/PLATELET
BASOS ABS: 0.1 10*3/uL (ref 0–0.1)
Basophils Relative: 1 %
Eosinophils Absolute: 0.1 10*3/uL (ref 0–0.7)
Eosinophils Relative: 2 %
HEMATOCRIT: 42.6 % (ref 35.0–47.0)
HEMOGLOBIN: 13.8 g/dL (ref 12.0–16.0)
LYMPHS PCT: 17 %
Lymphs Abs: 1 10*3/uL (ref 1.0–3.6)
MCH: 28.1 pg (ref 26.0–34.0)
MCHC: 32.4 g/dL (ref 32.0–36.0)
MCV: 86.7 fL (ref 80.0–100.0)
MONO ABS: 0.7 10*3/uL (ref 0.2–0.9)
Monocytes Relative: 12 %
NEUTROS ABS: 3.9 10*3/uL (ref 1.4–6.5)
NEUTROS PCT: 68 %
Platelets: 193 10*3/uL (ref 150–440)
RBC: 4.91 MIL/uL (ref 3.80–5.20)
RDW: 14.8 % — AB (ref 11.5–14.5)
WBC: 5.9 10*3/uL (ref 3.6–11.0)

## 2015-10-03 LAB — TROPONIN I

## 2015-10-03 MED ORDER — RISPERIDONE 0.5 MG PO TABS
2.0000 mg | ORAL_TABLET | Freq: Every day | ORAL | Status: DC
  Administered 2015-10-03: 21:00:00 2 mg via ORAL
  Filled 2015-10-03: qty 4

## 2015-10-03 MED ORDER — ACETAMINOPHEN 325 MG PO TABS: 650.0000 mg | ORAL_TABLET | Freq: Four times a day (QID) | ORAL | Status: DC | PRN

## 2015-10-03 MED ORDER — RISPERIDONE 1 MG PO TABS
1.0000 mg | ORAL_TABLET | Freq: Two times a day (BID) | ORAL | Status: DC
  Filled 2015-10-03: qty 2

## 2015-10-03 MED ORDER — RANITIDINE HCL 50 MG/2ML IJ SOLN
75.0000 mg | Freq: Once | INTRAVENOUS | Status: AC
  Administered 2015-10-03: 75 mg via INTRAVENOUS
  Filled 2015-10-03: qty 3

## 2015-10-03 MED ORDER — METHYLPREDNISOLONE SODIUM SUCC 125 MG IJ SOLR
125.0000 mg | Freq: Once | INTRAMUSCULAR | Status: AC
  Administered 2015-10-03: 125 mg via INTRAVENOUS
  Filled 2015-10-03: qty 2

## 2015-10-03 MED ORDER — ESCITALOPRAM OXALATE 10 MG PO TABS
10.0000 mg | ORAL_TABLET | Freq: Every day | ORAL | Status: DC
  Administered 2015-10-03: 10 mg via ORAL
  Filled 2015-10-03: qty 1

## 2015-10-03 MED ORDER — DIPHENHYDRAMINE HCL 50 MG/ML IJ SOLN: 25.0000 mg | Freq: Four times a day (QID) | INTRAMUSCULAR | Status: DC | PRN

## 2015-10-03 MED ORDER — STROKE: EARLY STAGES OF RECOVERY BOOK
Freq: Once | Status: AC
  Administered 2015-10-03: 21:00:00

## 2015-10-03 MED ORDER — SODIUM CHLORIDE 0.9 % IJ SOLN
3.0000 mL | Freq: Two times a day (BID) | INTRAMUSCULAR | Status: DC
Start: 2015-10-03 — End: 2015-10-04
  Administered 2015-10-03 – 2015-10-04 (×2): 3 mL via INTRAVENOUS

## 2015-10-03 MED ORDER — HEPARIN SODIUM (PORCINE) 5000 UNIT/ML IJ SOLN
5000.0000 [IU] | Freq: Three times a day (TID) | INTRAMUSCULAR | Status: DC
  Administered 2015-10-03 – 2015-10-04 (×2): 5000 [IU] via SUBCUTANEOUS
  Filled 2015-10-03 (×2): qty 1

## 2015-10-03 MED ORDER — CIPROFLOXACIN HCL 0.3 % OP SOLN
2.0000 [drp] | OPHTHALMIC | Status: DC
  Administered 2015-10-03 – 2015-10-04 (×4): 2 [drp] via OPHTHALMIC
  Filled 2015-10-03: qty 2.5

## 2015-10-03 MED ORDER — RISPERIDONE 0.5 MG PO TABS
1.0000 mg | ORAL_TABLET | Freq: Every morning | ORAL | Status: DC
  Administered 2015-10-04: 1 mg via ORAL
  Filled 2015-10-03: qty 2

## 2015-10-03 MED ORDER — ASPIRIN 81 MG PO CHEW
81.0000 mg | CHEWABLE_TABLET | Freq: Every day | ORAL | Status: DC
  Administered 2015-10-03 – 2015-10-04 (×2): 81 mg via ORAL
  Filled 2015-10-03 (×2): qty 1

## 2015-10-03 MED ORDER — ASPIRIN EC 81 MG PO TBEC: 81.0000 mg | DELAYED_RELEASE_TABLET | Freq: Every day | ORAL | Status: DC

## 2015-10-03 MED ORDER — DIPHENHYDRAMINE HCL 12.5 MG/5ML PO ELIX
50.0000 mg | ORAL_SOLUTION | Freq: Once | ORAL | Status: AC
  Administered 2015-10-03: 50 mg via ORAL
  Filled 2015-10-03: qty 20

## 2015-10-03 MED ORDER — TRAZODONE HCL 50 MG PO TABS
25.0000 mg | ORAL_TABLET | ORAL | Status: DC | PRN
  Administered 2015-10-03 – 2015-10-04 (×2): 50 mg via ORAL
  Filled 2015-10-03: qty 1
  Filled 2015-10-03: qty 2

## 2015-10-03 MED ORDER — ACETAMINOPHEN 650 MG RE SUPP: 650.0000 mg | Freq: Four times a day (QID) | RECTAL | Status: DC | PRN

## 2015-10-03 MED ORDER — INFLUENZA VAC SPLIT QUAD 0.5 ML IM SUSY: 0.5000 mL | PREFILLED_SYRINGE | INTRAMUSCULAR | Status: DC

## 2015-10-03 MED ORDER — MIRTAZAPINE 15 MG PO TABS
15.0000 mg | ORAL_TABLET | Freq: Every day | ORAL | Status: DC
  Administered 2015-10-03: 15 mg via ORAL
  Filled 2015-10-03: qty 1

## 2015-10-03 MED ORDER — HEPARIN SODIUM (PORCINE) 5000 UNIT/ML IJ SOLN: 5000.0000 [IU] | Freq: Three times a day (TID) | INTRAMUSCULAR | Status: DC

## 2015-10-03 MED ORDER — DIVALPROEX SODIUM ER 250 MG PO TB24
250.0000 mg | ORAL_TABLET | Freq: Two times a day (BID) | ORAL | Status: DC
  Administered 2015-10-03 – 2015-10-04 (×2): 250 mg via ORAL
  Filled 2015-10-03 (×2): qty 1

## 2015-10-03 NOTE — H&P (Addendum)
[facility] [place] [facility]   PATIENT NAME: Ruth Juarez    MR#:  [id number]  DATE OF BIRTH:  10/07/1927  DATE OF ADMISSION:  10/03/2015  PRIMARY CARE PHYSICIAN: No PCP Per Patient   REQUESTING/REFERRING PHYSICIAN: Dr. Darnelle CatalanMalinda  CHIEF COMPLAINT:   Chief Complaint  Patient presents with  . Altered Mental Status    HISTORY OF PRESENT ILLNESS:  Ruth AzureLouise Juarez  is a 81 y.o. female with a known history of advanced dementia with behavioral disturbance, anxiety and depression presents him home with right-sided facial droop. The history is provided by her daughter Bobbe MedicoDiane White who is also her medical power of attorney and her granddaughter. They report that yesterday she was in her usual state of health. This morning she slept much later than usual, slept until noon. When she woke up the right side of her face was very droopy and her lips were swollen she did not "look like herself". She was moving her extremities normally. She was able to eat breakfast with no choking or difficulty swallowing. Symptoms lasted for 1-2 hours and she then developed a nosebleed which prompted her daughter to call EMS. The patient has had no specific complaints. Her CT scan is negative. On my examination her upper lip is swollen and the family reports it seems to be getting more swollen. She is being admitted for TIA and also for possible angioedema.  PAST MEDICAL HISTORY:   Past Medical History  Diagnosis Date  . Dementia   . Anxiety     PAST SURGICAL HISTORY:   Past Surgical History  Procedure Laterality Date  . Abdominal hysterectomy    . Tonsillectomy      SOCIAL HISTORY:   Social History  Substance Use Topics  . Smoking status: Never Smoker   . Smokeless tobacco: Not on file  . Alcohol Use: No     Comment: Patient denies     FAMILY HISTORY:   Family History  Problem Relation Age of Onset  . Alzheimer's disease Mother   . Alzheimer's disease Sister    . Breast cancer Sister   . Melanoma Sister     DRUG ALLERGIES:   Allergies  Allergen Reactions  . Bee Venom Swelling    REVIEW OF SYSTEMS:   ROS unable to obtain due to altered mental status, Alzheimer's disease  MEDICATIONS AT HOME:   Prior to Admission medications   Medication Sig Start Date End Date Taking? Authorizing Provider  acetaminophen (TYLENOL) 325 MG tablet Take 325 mg by mouth every 6 (six) hours as needed for mild pain or headache.    Yes Historical Provider, MD  aspirin 81 MG chewable tablet Chew 81 mg by mouth daily.   Yes Historical Provider, MD  calcium carbonate (TUMS - DOSED IN MG ELEMENTAL CALCIUM) 500 MG chewable tablet Chew 1 tablet by mouth as needed for indigestion or heartburn.    Yes Historical Provider, MD  clotrimazole (LOTRIMIN) 1 % cream Apply 1 application topically every 8 (eight) hours as needed (for irritation on skin).   Yes Historical Provider, MD  divalproex (DEPAKOTE ER) 250 MG 24 hr tablet Take 250 mg by mouth 2 (two) times daily.   Yes Historical Provider, MD  escitalopram (LEXAPRO) 10 MG tablet Take 10 mg by mouth at bedtime.   Yes Historical Provider, MD  Melatonin 1 MG TABS Take 1 mg by mouth at bedtime as needed (for sleep).   Yes Historical Provider, MD  mirtazapine (REMERON) 15 MG  tablet Take 15 mg by mouth at bedtime.   Yes Historical Provider, MD  Multiple Vitamin (MULTIVITAMIN WITH MINERALS) TABS tablet Take 1 tablet by mouth daily.   Yes Historical Provider, MD  risperiDONE (RISPERDAL) 1 MG tablet Take 1-2 mg by mouth 2 (two) times daily. Pt takes one tablet in the morning and two tablets at bedtime.   Yes Historical Provider, MD  traMADol (ULTRAM) 50 MG tablet Take 25-50 mg by mouth 2 (two) times daily as needed for moderate pain.   Yes Historical Provider, MD  traZODone (DESYREL) 50 MG tablet Take 25-50 mg by mouth every 4 (four) hours as needed for sleep (and/or anxiety).    Yes Historical Provider, MD  vitamin B-12  (CYANOCOBALAMIN) 1000 MCG tablet Take 1,000 mcg by mouth daily.   Yes Historical Provider, MD  cephALEXin (KEFLEX) 500 MG capsule Take 1 capsule (500 mg total) by mouth 2 (two) times daily. Patient not taking: Reported on 10/03/2015 07/03/15   Sharyn Creamer, MD  risperiDONE (RISPERDAL) 0.5 MG tablet Take 1 tablet (0.5 mg total) by mouth 2 (two) times daily as needed (agitation). Patient not taking: Reported on 10/03/2015 07/03/15   Audery Amel, MD      VITAL SIGNS:  Blood pressure 138/62, pulse 74, temperature 98.2 F (36.8 C), temperature source Oral, resp. rate 16, height  (1.575 m), weight 63.504 kg (140 lb), SpO2 100 %.  PHYSICAL EXAMINATION:  GENERAL:  81 y.o.-year-old patient lying in the bed with no acute distress.  EYES: Pupils equal, round, reactive to light and accommodation. No scleral icterus. Extraocular muscles intact.  HEENT: Left eye with thick yellow drainage, some. Orbital swelling and erythema, no induration Upper lip quite swollen, Head atraumatic, normocephalic. Oropharynx and nasopharynx clear. His membranes are moist, good dentition  NECK:  Supple, no jugular venous distention. No thyroid enlargement, no tenderness. No stridor LUNGS: Normal breath sounds bilaterally, no wheezing, rales,rhonchi or crepitation. No use of accessory muscles of respiration.  CARDIOVASCULAR: S1, S2 normal. No murmurs, rubs, or gallops.  ABDOMEN: Soft, nontender, nondistended. Bowel sounds present. No organomegaly or mass. No guarding no rebound EXTREMITIES: Trace pedal edema over the left ankle, cyanosis, or clubbing. Pulses 2+ NEUROLOGIC: She is very confused and has difficulty pertussis pending in neurologic examination. Cranial nerves are grossly intact. Muscle strength seems to be 5 out of 5 in all extremities. Sensation seems to be intact.  PSYCHIATRIC: The patient is alert and oriented to person only SKIN: No obvious rash, lesion, or ulcer.   LABORATORY PANEL:   CBC  Recent  Labs Lab 10/03/15 1433  WBC 5.9  HGB 13.8  HCT 42.6  PLT 193   ------------------------------------------------------------------------------------------------------------------  Chemistries   Recent Labs Lab 10/03/15 1433  NA 141  K 3.9  CL 105  CO2 29  GLUCOSE 94  BUN 29*  CREATININE 0.74  CALCIUM 9.5  AST 19  ALT 11*  ALKPHOS 37*  BILITOT 0.5   ------------------------------------------------------------------------------------------------------------------  Cardiac Enzymes  Recent Labs Lab 10/03/15 1433  TROPONINI <0.03   ------------------------------------------------------------------------------------------------------------------  RADIOLOGY:  Dg Chest 2 View  10/03/2015  CLINICAL DATA:  TA, advanced dementia EXAM: CHEST  2 VIEW COMPARISON:  07/24/2015 FINDINGS: Enlargement of cardiac silhouette. Mediastinal contours and pulmonary vascularity normal. Lungs hyperaerated but clear. No pleural effusion or pneumothorax. Bones unremarkable. IMPRESSION: Enlargement of cardiac silhouette. No acute abnormalities. Electronically Signed   By: Ulyses Southward M.D.   On: 10/03/2015 14:25   Ct Head Wo Contrast  10/03/2015  CLINICAL DATA:  TIA.  Dementia.  Right facial droop EXAM: CT HEAD WITHOUT CONTRAST TECHNIQUE: Contiguous axial images were obtained from the base of the skull through the vertex without intravenous contrast. COMPARISON:  CT head 07/02/2015 FINDINGS: Moderate atrophy. Hypodensity in the cerebral white matter bilaterally is stable from the prior study. Chronic infarct left external capsule unchanged No acute infarct.  Negative for hemorrhage or mass. Calvarium intact. IMPRESSION: Atrophy and chronic microvascular ischemia.  No acute abnormality. Electronically Signed   By: Marlan Palau M.D.   On: 10/03/2015 14:19    EKG:   Orders placed or performed during the hospital encounter of 10/03/15  . ED EKG  . ED EKG    IMPRESSION AND PLAN:   #1 TIA: Right  sided facial droop now resolved. CT scan is negative. Will obtain MRI. There is no evidence of stroke will not pursue carotid Dopplers and 2-D echocardiogram unless MRI is positive.. Observe her overnight on telemetry. Continue aspirin 81 mg daily as she is been on at home. Very difficult to assess due to advanced dementia. Will obtain physical therapy evaluation in the morning.  #2 angioedema: This may actually be the cause of her facial swelling today. Family reports that initially her lower lip was swollen and now her upper lip and left eyes seem to be quite swollen. Continue with Benadryl. There does not seem to be any airway compromise. Monitor closely and if angioedema worsens could add steroids. Family denies any new allergens.  #3 dementia with behavioral disturbance: Continue home medications including Depakote, Lexapro, mirtazapine, Risperdal, trazodone. She is being followed by [facility] clinic neurology, Dr. Clelia Croft. She has an EEG pending in a week.  #4 left eye conjunctivitis: This may be allergic conjunctivitis but she did spend the day yesterday at the social services office and feels that she may have come in contact with infectious conjunctivitis. Will start ciprofloxacin drops.  Prophylaxis heparin for DVT prophylaxis. No GI prophylaxis  All the records are reviewed and case discussed with ED provider. Management plans discussed with the patient, family (daughter and granddaughter) and they are in agreement. Her daughter Bobbe Medico is her medical power of attorney.  CODE STATUS: Full  TOTAL TIME TAKING CARE OF THIS PATIENT: 45 minutes.  Greater than 50% of time spent in coordination of care and counseling.  Elby Showers M.D on 10/03/2015 at 6:12 PM  Between 7am to 6pm - Pager - [phone]  After 6pm go to [email or web address] - password [email or web address] [facility]  [facility]  Office  [phone]  CC: Primary care physician; No PCP Per Patient

## 2015-10-03 NOTE — ED Notes (Signed)
Pt family states increase in swelling to upper lip. Dr Darnelle CatalanMalinda informed. Respirations easy on labored. NAD noted.

## 2015-10-03 NOTE — ED Notes (Signed)
Report called to floor, pt currently in MRI

## 2015-10-03 NOTE — ED Notes (Signed)
Pt here via ems, per family "her face doesn't look right and she slept too long this AM" ems also states that they were out doing a lot of running around yesterday. Pt has hx of advanced dementia

## 2015-10-03 NOTE — ED Notes (Signed)
Patient transported to MRI 

## 2015-10-03 NOTE — ED Provider Notes (Signed)
[facility] Emergency Department Provider Note  ____________________________________________  Time seen: Approximately 1:47 PM  I have reviewed the triage vital signs and the nursing notes.   HISTORY  Chief Complaint Altered Mental Status    HPI Ruth Juarez is a 81 y.o. female who has a history of dementia and wandering. [facility] and her condition was stabilized does not wander at present. Patient slept much longer than usual this morning and when she was woken up she had a pronounced left sided facial droop. Family reported leg seemed weak as well Be sure if the left side was weaker than the right are not but most of the problem was a left-sided facial droop. She woke from sleep with this having gone to bed last night. Patient has not had this before. Nothing seemed to make it better or worse there are no associated symptoms.  Past Medical History  Diagnosis Date  . Dementia     Patient Active Problem List   Diagnosis Date Noted  . Alzheimer's dementia with behavioral disturbance 07/03/2015  . Urinary tract infection 07/03/2015    Past Surgical History  Procedure Laterality Date  . Abdominal hysterectomy    . Tonsillectomy      Current Outpatient Rx  Name  Route  Sig  Dispense  Refill  . acetaminophen (TYLENOL) 325 MG tablet   Oral   Take 325 mg by mouth every 6 (six) hours as needed for mild pain or headache.          Marland Kitchen. aspirin 81 MG chewable tablet   Oral   Chew 81 mg by mouth daily.         . calcium carbonate (TUMS - DOSED IN MG ELEMENTAL CALCIUM) 500 MG chewable tablet   Oral   Chew 1 tablet by mouth as needed for indigestion or heartburn.          . clotrimazole (LOTRIMIN) 1 % cream   Topical   Apply 1 application topically every 8 (eight) hours as needed (for irritation on skin).         Marland Kitchen. divalproex (DEPAKOTE ER) 250 MG 24 hr tablet   Oral   Take 250 mg by mouth 2 (two) times daily.         Marland Kitchen.  escitalopram (LEXAPRO) 10 MG tablet   Oral   Take 10 mg by mouth at bedtime.         . Melatonin 1 MG TABS   Oral   Take 1 mg by mouth at bedtime as needed (for sleep).         . mirtazapine (REMERON) 15 MG tablet   Oral   Take 15 mg by mouth at bedtime.         . Multiple Vitamin (MULTIVITAMIN WITH MINERALS) TABS tablet   Oral   Take 1 tablet by mouth daily.         . risperiDONE (RISPERDAL) 1 MG tablet   Oral   Take 1-2 mg by mouth 2 (two) times daily. Pt takes one tablet in the morning and two tablets at bedtime.         . traMADol (ULTRAM) 50 MG tablet   Oral   Take 25-50 mg by mouth 2 (two) times daily as needed for moderate pain.         . traZODone (DESYREL) 50 MG tablet   Oral   Take 25-50 mg by mouth every 4 (four) hours as needed for sleep (and/or anxiety).          .Marland Kitchen  vitamin B-12 (CYANOCOBALAMIN) 1000 MCG tablet   Oral   Take 1,000 mcg by mouth daily.         . cephALEXin (KEFLEX) 500 MG capsule   Oral   Take 1 capsule (500 mg total) by mouth 2 (two) times daily. Patient not taking: Reported on 10/03/2015   14 capsule   0   . risperiDONE (RISPERDAL) 0.5 MG tablet   Oral   Take 1 tablet (0.5 mg total) by mouth 2 (two) times daily as needed (agitation). Patient not taking: Reported on 10/03/2015   60 tablet   0     Allergies Bee venom  Family History  Problem Relation Age of Onset  . Family history unknown: Yes    Social History Social History  Substance Use Topics  . Smoking status: Never Smoker   . Smokeless tobacco: None  . Alcohol Use: No     Comment: Patient denies     Review of Systems Constitutional: No fever/chills Eyes: No visual changes. ENT: No sore throat. Cardiovascular: Denies chest pain. Respiratory: Denies shortness of breath. Gastrointestinal: No abdominal pain.  No nausea, no vomiting.  No diarrhea.  No constipation. Genitourinary: Negative for dysuria. Musculoskeletal: Negative for back pain. Skin:  Negative for rash. Neurological: Negative for headaches,   10-point ROS otherwise negative.  ____________________________________________   PHYSICAL EXAM:  VITAL SIGNS: ED Triage Vitals  Enc Vitals Group     BP 10/03/15 1313 121/105 mmHg     Pulse Rate 10/03/15 1313 90     Resp 10/03/15 1313 16     Temp 10/03/15 1313 98.3 F (36.8 C)     Temp Source 10/03/15 1313 Oral     SpO2 10/03/15 1313 99 %     Weight 10/03/15 1313 140 lb (63.504 kg)     Height 10/03/15 1313  (1.575 m)     Head Cir --      Peak Flow --      Pain Score --      Pain Loc --      Pain Edu? --      Excl. in GC? --     Constitutional: Alert. Well appearing and in no acute distress. Eyes: Conjunctivae are normal. PERRL. EOMI. Head: Atraumatic. Nose: No congestion/rhinnorhea. Patient had a couple drops of blood come out of her nose. There is a small spot of blood the left side of the nasal septum in Kiesselbach's plexus area. There is no blood that seems to be coming from deeper in the nose. Mouth/Throat: Mucous membranes are moist.  Oropharynx non-erythematous. Neck: No stridor.  Cardiovascular: Normal rate, regular rhythm. Grossly normal heart sounds.  Good peripheral circulation. Respiratory: Normal respiratory effort.  No retractions. Lungs CTAB. Gastrointestinal: Soft and nontender. No distention. No abdominal bruits. No CVA tenderness. Musculoskeletal: No lower extremity tenderness nor edema.  No joint effusions. Neurologic:  Normal speech and language. No gross focal neurologic deficits are appreciated. No gait instability. Skin:  Skin is warm, dry and intact. No rash noted. Psychiatric: Mood and affect are normal. Speech and behavior are normal.  ____________________________________________   LABS (all labs ordered are listed, but only abnormal results are displayed)  Labs Reviewed  COMPREHENSIVE METABOLIC PANEL - Abnormal; Notable for the following:    BUN 29 (*)    ALT 11 (*)     Alkaline Phosphatase 37 (*)    All other components within normal limits  CBC WITH DIFFERENTIAL/PLATELET - Abnormal; Notable for the following:  RDW 14.8 (*)    All other components within normal limits  TROPONIN I   ____________________________________________  EKG  EKG is currently not available ____________________________________________  RADIOLOGY  Chest x-ray read and her interpreter by the radiologist shows slightly enlarged heart CT of the head read and interpreted by radiologist shows microvascular change otherwise normal ____________________________________________   PROCEDURES   ____________________________________________   INITIAL IMPRESSION / ASSESSMENT AND PLAN / ED COURSE  Pertinent labs & imaging results that were available during my care of the patient were reviewed by me and considered in my medical decision making (see chart for details). In the ER patient developed swelling of the upper lip and give her some Benadryl. Patient's daughter said she gets swelling with "nerves" she's had this before at least once. The patient daughter gives her her own medicine to keep her calm with her Alzheimer's.        ____________________________________________   FINAL CLINICAL IMPRESSION(S) / ED DIAGNOSES  Final diagnoses:  Transient cerebral ischemia, unspecified transient cerebral ischemia type      Arnaldo Natal, MD 10/03/15 1731

## 2015-10-04 DIAGNOSIS — G459 Transient cerebral ischemic attack, unspecified: Secondary | ICD-10-CM | POA: Diagnosis not present

## 2015-10-04 LAB — CBC
HCT: 39 % (ref 35.0–47.0)
HEMOGLOBIN: 13 g/dL (ref 12.0–16.0)
MCH: 28.9 pg (ref 26.0–34.0)
MCHC: 33.2 g/dL (ref 32.0–36.0)
MCV: 87.1 fL (ref 80.0–100.0)
PLATELETS: 189 10*3/uL (ref 150–440)
RBC: 4.48 MIL/uL (ref 3.80–5.20)
RDW: 14.5 % (ref 11.5–14.5)
WBC: 4 10*3/uL (ref 3.6–11.0)

## 2015-10-04 LAB — BASIC METABOLIC PANEL
ANION GAP: 10 (ref 5–15)
BUN: 25 mg/dL — ABNORMAL HIGH (ref 6–20)
CO2: 24 mmol/L (ref 22–32)
Calcium: 9.1 mg/dL (ref 8.9–10.3)
Chloride: 107 mmol/L (ref 101–111)
Creatinine, Ser: 0.8 mg/dL (ref 0.44–1.00)
GFR calc Af Amer: >60 mL/min (ref >60)
Glucose, Bld: 266 mg/dL — ABNORMAL HIGH (ref 65–99)
POTASSIUM: 4.3 mmol/L (ref 3.5–5.1)
SODIUM: 141 mmol/L (ref 135–145)

## 2015-10-04 LAB — GLUCOSE, CAPILLARY: Glucose-Capillary: 184 mg/dL — ABNORMAL HIGH (ref 65–99)

## 2015-10-04 MED ORDER — CIPROFLOXACIN HCL 0.3 % OP SOLN: 2.0000 [drp] | OPHTHALMIC | Status: AC

## 2015-10-04 MED ORDER — INSULIN ASPART 100 UNIT/ML ~~LOC~~ SOLN: 0.0000 [IU] | Freq: Every day | SUBCUTANEOUS | Status: DC

## 2015-10-04 MED ORDER — INSULIN ASPART 100 UNIT/ML ~~LOC~~ SOLN
0.0000 [IU] | Freq: Three times a day (TID) | SUBCUTANEOUS | Status: DC
  Administered 2015-10-04: 08:00:00 2 [IU] via SUBCUTANEOUS
  Filled 2015-10-04: qty 2

## 2015-10-04 NOTE — Discharge Instructions (Signed)
Stroke Prevention °Some health problems and behaviors may make it more likely for you to have a stroke. Below are ways to lessen your risk of having a stroke.  °· Be active for at least 30 minutes on most or all days. °· Do not smoke. Try not to be around others who smoke. °· Do not drink too much alcohol. °¨ Do not have more than 2 drinks a day if you are a man. °¨ Do not have more than 1 drink a day if you are a woman and are not pregnant. °· Eat healthy foods, such as fruits and vegetables. If you were put on a specific diet, follow the diet as told. °· Keep your cholesterol levels under control through diet and medicines. Look for foods that are low in saturated fat, trans fat, cholesterol, and are high in fiber. °· If you have diabetes, follow all diet plans and take your medicine as told. °· Ask your doctor if you need treatment to lower your blood pressure. If you have high blood pressure (hypertension), follow all diet plans and take your medicine as told by your doctor. °· If you are 18-39 years old, have your blood pressure checked every 3-5 years. If you are age 40 or older, have your blood pressure checked every year. °· Keep a healthy weight. Eat foods that are low in calories, salt, saturated fat, trans fat, and cholesterol. °· Do not take drugs. °· Avoid birth control pills, if this applies. Talk to your doctor about the risks of taking birth control pills. °· Talk to your doctor if you have sleep problems (sleep apnea). °· Take all medicine as told by your doctor. °¨ You may be told to take aspirin or blood thinner medicine. Take this medicine as told by your doctor. °¨ Understand your medicine instructions. °· Make sure any other conditions you have are being taken care of. °GET HELP RIGHT AWAY IF: °· You suddenly lose feeling (you feel numb) or have weakness in your face, arm, or leg. °· Your face or eyelid hangs down to one side. °· You suddenly feel confused. °· You have trouble talking (aphasia)  or understanding what people are saying. °· You suddenly have trouble seeing in one or both eyes. °· You suddenly have trouble walking. °· You are dizzy. °· You lose your balance or your movements are clumsy (uncoordinated). °· You suddenly have a very bad headache and you do not know the cause. °· You have new chest pain. °· Your heart feels like it is fluttering or skipping a beat (irregular heartbeat). °Do not wait to see if the symptoms above go away. Get help right away. Call your local emergency services (911 in [place] Do not drive yourself to the hospital. °  °This information is not intended to replace advice given to you by your health care provider. Make sure you discuss any questions you have with your health care provider. °  °Document Released: 06/02/2012 Document Revised: 12/23/2014 Document Reviewed: 06/04/2013 °Elsevier Interactive Patient Education ©2016 [employer] ° °

## 2015-10-04 NOTE — Discharge Summary (Signed)
[facility] [place] at [facility]   PATIENT NAME: Ruth Juarez    MR#:  [id number]  DATE OF BIRTH:  Apr 27, 1927  DATE OF ADMISSION:  10/03/2015 ADMITTING PHYSICIAN: Gale Journey, MD  DATE OF DISCHARGE: 10/04/2015  PRIMARY CARE PHYSICIAN: No PCP Per Patient    ADMISSION DIAGNOSIS:  CVA (cerebral infarction) [I63.9] Transient cerebral ischemia, unspecified transient cerebral ischemia type [G45.9]  DISCHARGE DIAGNOSIS:  Active Problems:   TIA (transient ischemic attack)   SECONDARY DIAGNOSIS:   Past Medical History  Diagnosis Date  . Dementia   . Anxiety     HOSPITAL COURSE:   #1 TIA: Right sided facial droop now resolved. CT scan is negative. negative MRI. Continue aspirin 81 mg daily as she is been on at home.  As per daughter- she is back to her baseline now- she walks without any support, and daughter is home 53X7.  #2 angioedema:  resolved with Benadryl. There does not seem to be any airway compromise.  No new meds, no any trigers noted from her meds. Completely resolved now.  #3 dementia with behavioral disturbance: Continue home medications including Depakote, Lexapro, mirtazapine, Risperdal, trazodone. She is being followed by [facility] neurology, Dr. Sherryll Burger. She has an EEG pending in a week.  #4 left eye conjunctivitis: This may be allergic conjunctivitis but she did spend the day yesterday at the social services office and feels that she may have come in contact with infectious conjunctivitis.  ciprofloxacin drops.  Eye much better now.   DISCHARGE CONDITIONS:   Stable.  CONSULTS OBTAINED:     DRUG ALLERGIES:   Allergies  Allergen Reactions  . Bee Venom Swelling    DISCHARGE MEDICATIONS:   Current Discharge Medication List    START taking these medications   Details  ciprofloxacin (CILOXAN) 0.3 % ophthalmic solution Place 2 drops into the left eye every 4 (four) hours while awake. Administer 1 drop,  every 2 hours, while awake, for 2 days. Then 1 drop, every 4 hours, while awake, for the next 5 days. Qty: 5 mL, Refills: 0      CONTINUE these medications which have NOT CHANGED   Details  acetaminophen (TYLENOL) 325 MG tablet Take 325 mg by mouth every 6 (six) hours as needed for mild pain or headache.     aspirin 81 MG chewable tablet Chew 81 mg by mouth daily.    calcium carbonate (TUMS - DOSED IN MG ELEMENTAL CALCIUM) 500 MG chewable tablet Chew 1 tablet by mouth as needed for indigestion or heartburn.     clotrimazole (LOTRIMIN) 1 % cream Apply 1 application topically every 8 (eight) hours as needed (for irritation on skin).    divalproex (DEPAKOTE ER) 250 MG 24 hr tablet Take 250 mg by mouth 2 (two) times daily.    escitalopram (LEXAPRO) 10 MG tablet Take 10 mg by mouth at bedtime.    Melatonin 1 MG TABS Take 1 mg by mouth at bedtime as needed (for sleep).    mirtazapine (REMERON) 15 MG tablet Take 15 mg by mouth at bedtime.    Multiple Vitamin (MULTIVITAMIN WITH MINERALS) TABS tablet Take 1 tablet by mouth daily.    !! risperiDONE (RISPERDAL) 1 MG tablet Take 1-2 mg by mouth 2 (two) times daily. Pt takes one tablet in the morning and two tablets at bedtime.    traMADol (ULTRAM) 50 MG tablet Take 25-50 mg by mouth 2 (two) times daily as needed for moderate pain.  traZODone (DESYREL) 50 MG tablet Take 25-50 mg by mouth every 4 (four) hours as needed for sleep (and/or anxiety).     vitamin B-12 (CYANOCOBALAMIN) 1000 MCG tablet Take 1,000 mcg by mouth daily.    !! risperiDONE (RISPERDAL) 0.5 MG tablet Take 1 tablet (0.5 mg total) by mouth 2 (two) times daily as needed (agitation). Qty: 60 tablet, Refills: 0     !! - Potential duplicate medications found. Please discuss with provider.    STOP taking these medications     cephALEXin (KEFLEX) 500 MG capsule          DISCHARGE INSTRUCTIONS:    folow with PMD in 1-2 weeks.  If you experience worsening of your  admission symptoms, develop shortness of breath, life threatening emergency, suicidal or homicidal thoughts you must seek medical attention immediately by calling 911 or calling your MD immediately  if symptoms less severe.  You Must read complete instructions/literature along with all the possible adverse reactions/side effects for all the Medicines you take and that have been prescribed to you. Take any new Medicines after you have completely understood and accept all the possible adverse reactions/side effects.   Please note  You were cared for by a hospitalist during your hospital stay. If you have any questions about your discharge medications or the care you received while you were in the hospital after you are discharged, you can call the unit and asked to speak with the hospitalist on call if the hospitalist that took care of you is not available. Once you are discharged, your primary care physician will handle any further medical issues. Please note that NO REFILLS for any discharge medications will be authorized once you are discharged, as it is imperative that you return to your primary care physician (or establish a relationship with a primary care physician if you do not have one) for your aftercare needs so that they can reassess your need for medications and monitor your lab values.    Today   CHIEF COMPLAINT:   Chief Complaint  Patient presents with  . Altered Mental Status    HISTORY OF PRESENT ILLNESS:  Ruth Juarez  is a 81 y.o. female with a known history of advanced dementia with behavioral disturbance, anxiety and depression presents him home with right-sided facial droop. The history is provided by her daughter Bobbe MedicoDiane White who is also her medical power of attorney and her granddaughter. They report that yesterday she was in her usual state of health. This morning she slept much later than usual, slept until noon. When she woke up the right side of her face was very  droopy and her lips were swollen she did not "look like herself". She was moving her extremities normally. She was able to eat breakfast with no choking or difficulty swallowing. Symptoms lasted for 1-2 hours and she then developed a nosebleed which prompted her daughter to call EMS. The patient has had no specific complaints. Her CT scan is negative. On my examination her upper lip is swollen and the family reports it seems to be getting more swollen. She is being admitted for TIA and also for possible angioedema.   VITAL SIGNS:  Blood pressure 125/99, pulse 73, temperature 98.1 F (36.7 C), temperature source Oral, resp. rate 16, height 5\' 2"  (1.575 m), weight 63.504 kg (140 lb), SpO2 97 %.  I/O:  No intake or output data in the 24 hours ending 10/04/15 0940  PHYSICAL EXAMINATION:  GENERAL:  81 y.o.-year-old patient  sitting in chair with no acute distress.  EYES: Pupils equal, round, reactive to light and accommodation. No scleral icterus. Extraocular muscles intact.  HEENT: Head atraumatic, normocephalic. Oropharynx and nasopharynx clear.  NECK:  Supple, no jugular venous distention. No thyroid enlargement, no tenderness.  LUNGS: Normal breath sounds bilaterally, no wheezing, rales,rhonchi or crepitation. No use of accessory muscles of respiration.  CARDIOVASCULAR: S1, S2 normal. No murmurs, rubs, or gallops.  ABDOMEN: Soft, non-tender, non-distended. Bowel sounds present. No organomegaly or mass.  EXTREMITIES: No pedal edema, cyanosis, or clubbing.  NEUROLOGIC: Cranial nerves II through XII are intact. Muscle strength 5/5 in all extremities. Sensation intact. Gait not checked.  PSYCHIATRIC: The patient is alert and oriented to self.  SKIN: No obvious rash, lesion, or ulcer.   DATA REVIEW:   CBC  Recent Labs Lab 10/04/15 0436  WBC 4.0  HGB 13.0  HCT 39.0  PLT 189    Chemistries   Recent Labs Lab 10/03/15 1433 10/04/15 0436  NA 141 141  K 3.9 4.3  CL 105 107  CO2 29 24   GLUCOSE 94 266*  BUN 29* 25*  CREATININE 0.74 0.80  CALCIUM 9.5 9.1  AST 19  --   ALT 11*  --   ALKPHOS 37*  --   BILITOT 0.5  --     Cardiac Enzymes  Recent Labs Lab 10/03/15 1433  TROPONINI <0.03    Microbiology Results  Results for orders placed or performed during the hospital encounter of 07/20/15  Urine culture     Status: None   Collection Time: 07/23/15  9:58 AM  Result Value Ref Range Status   Specimen Description URINE, CLEAN CATCH  Final   Special Requests NONE  Final   Culture MULTIPLE SPECIES PRESENT, SUGGEST RECOLLECTION  Final   Report Status 07/24/2015 FINAL  Final    RADIOLOGY:  Dg Chest 2 View  10/03/2015  CLINICAL DATA:  TA, advanced dementia EXAM: CHEST  2 VIEW COMPARISON:  07/24/2015 FINDINGS: Enlargement of cardiac silhouette. Mediastinal contours and pulmonary vascularity normal. Lungs hyperaerated but clear. No pleural effusion or pneumothorax. Bones unremarkable. IMPRESSION: Enlargement of cardiac silhouette. No acute abnormalities. Electronically Signed   By: Ulyses Southward M.D.   On: 10/03/2015 14:25   Ct Head Wo Contrast  10/03/2015  CLINICAL DATA:  TIA.  Dementia.  Right facial droop EXAM: CT HEAD WITHOUT CONTRAST TECHNIQUE: Contiguous axial images were obtained from the base of the skull through the vertex without intravenous contrast. COMPARISON:  CT head 07/02/2015 FINDINGS: Moderate atrophy. Hypodensity in the cerebral white matter bilaterally is stable from the prior study. Chronic infarct left external capsule unchanged No acute infarct.  Negative for hemorrhage or mass. Calvarium intact. IMPRESSION: Atrophy and chronic microvascular ischemia.  No acute abnormality. Electronically Signed   By: Marlan Palau M.D.   On: 10/03/2015 14:19   Mr Brain Wo Contrast  10/03/2015  CLINICAL DATA:  Right-sided facial droop.  Advanced dementia. EXAM: MRI HEAD WITHOUT CONTRAST TECHNIQUE: Multiplanar, multiecho pulse sequences of the brain and surrounding  structures were obtained without intravenous contrast. COMPARISON:  Head CT 10/03/2015 FINDINGS: There is no evidence of acute infarct, intracranial hemorrhage, mass, midline shift, or extra-axial fluid collection. There is moderate generalized cerebral atrophy. Patchy T2 hyperintensities in the periventricular greater than subcortical cerebral white matter and pons are nonspecific but compatible with mild-to-moderate chronic small vessel ischemic disease. Prior bilateral cataract extraction is noted. There is a right maxillary sinus mucous retention cyst. Mastoid air cells  are clear. Major intracranial vascular flow voids are preserved. Limited visualization of the upper cervical spine demonstrates advanced spondylosis at C3-4 and C4-5 with likely mild-to-moderate spinal stenosis and mild cord flattening at C4-5. IMPRESSION: 1. No acute intracranial abnormality. 2. Mild-to-moderate chronic small vessel ischemic disease and cerebral atrophy. Electronically Signed   By: Sebastian Ache M.D.   On: 10/03/2015 19:02    EKG:   Orders placed or performed during the hospital encounter of 10/03/15  . ED EKG  . ED EKG      Management plans discussed with the patient, family and they are in agreement.  CODE STATUS:     Code Status Orders        Start     Ordered   10/03/15 2028  Full code   Continuous     10/03/15 2029    Advance Directive Documentation        Most Recent Value   Type of Advance Directive  Healthcare Power of Attorney   Pre-existing out of facility DNR order (yellow form or pink MOST form)     "MOST" Form in Place?        TOTAL TIME TAKING CARE OF THIS PATIENT: 35 minutes.    Altamese Dilling M.D on 10/04/2015 at 9:40 AM  Between 7am to 6pm - Pager - [phone]  After 6pm go to [email or web address] - password [email or web address] [facility]  [place] [facility]  Office  [phone]  CC: Primary care physician; No PCP Per Patient   Note: This dictation was prepared with Dragon  dictation along with smaller phrase technology. Any transcriptional errors that result from this process are unintentional.

## 2015-10-04 NOTE — Plan of Care (Signed)
Problem: Discharge/Transitional Outcomes Goal: Other Discharge Outcomes/Goals Outcome: Progressing Plan of care  Home with dtr Baseline dementia with need for assist with ADL, incontinent of urine at times. Pending neurology consult. VSS, stable, neg swallow eval.

## 2015-10-04 NOTE — Progress Notes (Signed)
Daughter given prescription x 1, d/c instructions r/t medications and activity given to daughter, stroke information given to daughter, voiced understanding, pt d/c home via wheelchair escorted by staff and daughter. Watson,Tracie I, RN 10/04/2015. 10:46 AM

## 2015-11-17 ENCOUNTER — Emergency Department
Admission: EM | Admit: 2015-11-17 | Discharge: 2015-11-18 | Disposition: A | Discharge status: Home / Self Care | Payer: Medicare Other | Attending: Emergency Medicine | Admitting: Emergency Medicine

## 2015-11-17 DIAGNOSIS — G309 Alzheimer's disease, unspecified: Secondary | ICD-10-CM | POA: Diagnosis not present

## 2015-11-17 DIAGNOSIS — R4182 Altered mental status, unspecified: Secondary | ICD-10-CM | POA: Insufficient documentation

## 2015-11-17 DIAGNOSIS — F0280 Dementia in other diseases classified elsewhere without behavioral disturbance: Secondary | ICD-10-CM | POA: Insufficient documentation

## 2015-11-17 DIAGNOSIS — Y9389 Activity, other specified: Secondary | ICD-10-CM | POA: Diagnosis not present

## 2015-11-17 DIAGNOSIS — W1839XA Other fall on same level, initial encounter: Secondary | ICD-10-CM | POA: Insufficient documentation

## 2015-11-17 DIAGNOSIS — Z7982 Long term (current) use of aspirin: Secondary | ICD-10-CM | POA: Diagnosis not present

## 2015-11-17 DIAGNOSIS — R531 Weakness: Secondary | ICD-10-CM | POA: Insufficient documentation

## 2015-11-17 DIAGNOSIS — G308 Other Alzheimer's disease: Secondary | ICD-10-CM | POA: Diagnosis not present

## 2015-11-17 DIAGNOSIS — Y998 Other external cause status: Secondary | ICD-10-CM | POA: Diagnosis not present

## 2015-11-17 DIAGNOSIS — R5383 Other fatigue: Secondary | ICD-10-CM | POA: Diagnosis not present

## 2015-11-17 DIAGNOSIS — Z79899 Other long term (current) drug therapy: Secondary | ICD-10-CM | POA: Insufficient documentation

## 2015-11-17 DIAGNOSIS — S62616A Displaced fracture of proximal phalanx of right little finger, initial encounter for closed fracture: Secondary | ICD-10-CM | POA: Diagnosis not present

## 2015-11-17 DIAGNOSIS — F0281 Dementia in other diseases classified elsewhere with behavioral disturbance: Secondary | ICD-10-CM | POA: Diagnosis present

## 2015-11-17 DIAGNOSIS — Y9289 Other specified places as the place of occurrence of the external cause: Secondary | ICD-10-CM | POA: Insufficient documentation

## 2015-11-17 DIAGNOSIS — F02818 Dementia in other diseases classified elsewhere, unspecified severity, with other behavioral disturbance: Secondary | ICD-10-CM | POA: Diagnosis present

## 2015-11-17 DIAGNOSIS — S62606A Fracture of unspecified phalanx of right little finger, initial encounter for closed fracture: Secondary | ICD-10-CM

## 2015-11-17 DIAGNOSIS — R442 Other hallucinations: Secondary | ICD-10-CM | POA: Diagnosis present

## 2015-11-17 NOTE — ED Notes (Addendum)
Per ems, pt from home with frequent fall over last "several days". Pt with history of dementia. Pt is alert to self only at this time. Skin pwd, resps unlabored. perrl sluggish 2mm. Pt denies pain, moves all extremities. Hematoma noted to right posterior hand. fsbs with ems 100

## 2015-11-18 ENCOUNTER — Emergency Department: Payer: Medicare Other

## 2015-11-18 DIAGNOSIS — G309 Alzheimer's disease, unspecified: Secondary | ICD-10-CM | POA: Diagnosis not present

## 2015-11-18 DIAGNOSIS — G308 Other Alzheimer's disease: Secondary | ICD-10-CM | POA: Diagnosis not present

## 2015-11-18 LAB — URINALYSIS COMPLETE WITH MICROSCOPIC (ARMC ONLY)
BILIRUBIN URINE: NEGATIVE
Bacteria, UA: NONE SEEN
Glucose, UA: NEGATIVE mg/dL
HGB URINE DIPSTICK: NEGATIVE
LEUKOCYTES UA: NEGATIVE
NITRITE: NEGATIVE
PH: 7 (ref 5.0–8.0)
Protein, ur: NEGATIVE mg/dL
Specific Gravity, Urine: 1.013 (ref 1.005–1.030)
Squamous Epithelial / LPF: NONE SEEN

## 2015-11-18 LAB — CBC WITH DIFFERENTIAL/PLATELET
BASOS PCT: 1 %
Basophils Absolute: 0 10*3/uL (ref 0–0.1)
EOS PCT: 3 %
Eosinophils Absolute: 0.2 10*3/uL (ref 0–0.7)
HEMATOCRIT: 36.6 % (ref 35.0–47.0)
Hemoglobin: 11.7 g/dL — ABNORMAL LOW (ref 12.0–16.0)
LYMPHS PCT: 21 %
Lymphs Abs: 1.3 10*3/uL (ref 1.0–3.6)
MCH: 27.7 pg (ref 26.0–34.0)
MCHC: 32.1 g/dL (ref 32.0–36.0)
MCV: 86.5 fL (ref 80.0–100.0)
MONO ABS: 0.8 10*3/uL (ref 0.2–0.9)
MONOS PCT: 13 %
NEUTROS ABS: 4 10*3/uL (ref 1.4–6.5)
Neutrophils Relative %: 62 %
PLATELETS: 220 10*3/uL (ref 150–440)
RBC: 4.23 MIL/uL (ref 3.80–5.20)
RDW: 14.4 % (ref 11.5–14.5)
WBC: 6.3 10*3/uL (ref 3.6–11.0)

## 2015-11-18 LAB — COMPREHENSIVE METABOLIC PANEL
ALT: 11 U/L — ABNORMAL LOW (ref 14–54)
ANION GAP: 6 (ref 5–15)
AST: 16 U/L (ref 15–41)
Albumin: 3.1 g/dL — ABNORMAL LOW (ref 3.5–5.0)
Alkaline Phosphatase: 36 U/L — ABNORMAL LOW (ref 38–126)
BILIRUBIN TOTAL: 0.2 mg/dL — AB (ref 0.3–1.2)
BUN: 29 mg/dL — ABNORMAL HIGH (ref 6–20)
CO2: 28 mmol/L (ref 22–32)
Calcium: 8.9 mg/dL (ref 8.9–10.3)
Chloride: 108 mmol/L (ref 101–111)
Creatinine, Ser: 0.72 mg/dL (ref 0.44–1.00)
GFR calc Af Amer: >60 mL/min (ref >60)
GLUCOSE: 96 mg/dL (ref 65–99)
POTASSIUM: 3.7 mmol/L (ref 3.5–5.1)
Sodium: 142 mmol/L (ref 135–145)
TOTAL PROTEIN: 6.4 g/dL — AB (ref 6.5–8.1)

## 2015-11-18 LAB — TSH: TSH: 2.77 u[IU]/mL (ref 0.350–4.500)

## 2015-11-18 LAB — TROPONIN I: Troponin I: <0.03 ng/mL (ref <0.031)

## 2015-11-18 MED ORDER — LORAZEPAM 0.5 MG PO TABS: 0.5000 mg | ORAL_TABLET | Freq: Four times a day (QID) | ORAL | Status: AC | PRN

## 2015-11-18 MED ORDER — HALOPERIDOL LACTATE 5 MG/ML IJ SOLN: 5.0000 mg | Freq: Once | INTRAMUSCULAR | Status: DC

## 2015-11-18 MED ORDER — HALOPERIDOL LACTATE 5 MG/ML IJ SOLN: 5.0000 mg | Freq: Three times a day (TID) | INTRAMUSCULAR | Status: DC | PRN

## 2015-11-18 MED ORDER — TRAZODONE HCL 50 MG PO TABS: 25.0000 mg | ORAL_TABLET | ORAL | Status: DC | PRN

## 2015-11-18 MED ORDER — HALOPERIDOL LACTATE 5 MG/ML IJ SOLN: 2.0000 mg | Freq: Three times a day (TID) | INTRAMUSCULAR | Status: DC | PRN

## 2015-11-18 NOTE — ED Notes (Signed)
Pt calmer, cna remains at bedside. Pt moving all extremities in bed without difficulty.

## 2015-11-18 NOTE — ED Notes (Signed)
Pt to xray and ct scan

## 2015-11-18 NOTE — ED Notes (Signed)
Report to ann, rn.  

## 2015-11-18 NOTE — ED Notes (Addendum)
CSW Stanton KidneyDebra in with pt and daughter discussing possible options for placement or assistance in the home.

## 2015-11-18 NOTE — ED Notes (Signed)
ENVIRONMENTAL ASSESSMENT  Potentially harmful objects out of patient reach: Yes.  Personal belongings secured: Yes.  Patient dressed in hospital provided attire only: Yes.  Plastic bags out of patient reach: Yes.  Patient care equipment (cords, cables, call bells, lines, and drains) shortened, removed, or accounted for: Yes.  Equipment and supplies removed from bottom of stretcher: Yes.  Potentially toxic materials out of patient reach: Yes.  Sharps container removed or out of patient reach: Yes.   BEHAVIORAL HEALTH ROUNDING Patient sleeping: Yes.   Patient alert and oriented: not applicable SLEEPING Behavior appropriate: Yes.  ; If no, describe: SLEEPING Nutrition and fluids offered: No SLEEPING Toileting and hygiene offered: NoSLEEPING Sitter present: not applicable Law enforcement present: Yes ODS 

## 2015-11-18 NOTE — Progress Notes (Signed)
CSW consult for information.  CSW met with patient and daughter to provide information for possible long term placement.  CSW provided a list of facilities in the area and a blank FL2 for daughter to take to patient's PCP to discuss her level of care.  Daughter was appreciative of the information provided and states she will follow up on Monday.  Casimer Lanius. Latanya Presser, MSW Clinical Social Work Department [phone] 3:49 PM

## 2015-11-18 NOTE — B[facility] Counselor (Signed)
1st assessment attempt unsuccessful. Pt mumbling, is unresponsive to writer and will not making eye contact at this time.

## 2015-11-18 NOTE — ED Provider Notes (Signed)
Time Seen: Approximately.----------------------------------------- 1:04 AM on 11/18/2015 -----------------------------------------     I have reviewed the triage notes  Chief Complaint: Fall   History of Present Illness: Ruth Juarez is a 81 y.o. female who presents via EMS from home. Patient has a long history of dementia and is not able to offer any significant history or review of systems. I placed a phone call with the daughter who stayed at home at this time. The patient apparently over the last 3 days has had some generalized weakness and some hallucinations. Patient's describing seeing people who are present and having some situational hallucinations thinking she is somewhat else. She has had similar hallucinations in the past the daughter states it's only lasted a single day. She's had some generalized weakness and some very minor falls without any significant head trauma and description by the daughter takes good care of her mother at home. The daughter did note a contusion on the back of her right hand. The patient herself is not offering any complaints and had some of her normal trazodone and Ativan prior to arrival. There's been no extubation of focal weakness, fever, vomiting, loose stool or diarrhea, or any other new concerns.  Past Medical History  Diagnosis Date  . Dementia   . Anxiety     Patient Active Problem List   Diagnosis Date Noted  . TIA (transient ischemic attack) 10/03/2015  . Alzheimer's dementia with behavioral disturbance 07/03/2015  . Urinary tract infection 07/03/2015    Past Surgical History  Procedure Laterality Date  . Abdominal hysterectomy    . Tonsillectomy      Past Surgical History  Procedure Laterality Date  . Abdominal hysterectomy    . Tonsillectomy      Current Outpatient Rx  Name  Route  Sig  Dispense  Refill  . acetaminophen (TYLENOL) 325 MG tablet   Oral   Take 325 mg by mouth every 6 (six) hours as needed for mild pain  or headache.          Marland Kitchen aspirin 81 MG chewable tablet   Oral   Chew 81 mg by mouth daily.         . calcium carbonate (TUMS - DOSED IN MG ELEMENTAL CALCIUM) 500 MG chewable tablet   Oral   Chew 1 tablet by mouth as needed for indigestion or heartburn.          . clotrimazole (LOTRIMIN) 1 % cream   Topical   Apply 1 application topically every 8 (eight) hours as needed (for irritation on skin).         Marland Kitchen divalproex (DEPAKOTE ER) 250 MG 24 hr tablet   Oral   Take 250 mg by mouth 2 (two) times daily.         Marland Kitchen escitalopram (LEXAPRO) 10 MG tablet   Oral   Take 10 mg by mouth at bedtime.         . Melatonin 1 MG TABS   Oral   Take 1 mg by mouth at bedtime as needed (for sleep).         . mirtazapine (REMERON) 15 MG tablet   Oral   Take 15 mg by mouth at bedtime.         . Multiple Vitamin (MULTIVITAMIN WITH MINERALS) TABS tablet   Oral   Take 1 tablet by mouth daily.         . risperiDONE (RISPERDAL) 0.5 MG tablet   Oral   Take 1 tablet (  0.5 mg total) by mouth 2 (two) times daily as needed (agitation). Patient not taking: Reported on 10/03/2015   60 tablet   0   . risperiDONE (RISPERDAL) 1 MG tablet   Oral   Take 1-2 mg by mouth 2 (two) times daily. Pt takes one tablet in the morning and two tablets at bedtime.         . traMADol (ULTRAM) 50 MG tablet   Oral   Take 25-50 mg by mouth 2 (two) times daily as needed for moderate pain.         . traZODone (DESYREL) 50 MG tablet   Oral   Take 25-50 mg by mouth every 4 (four) hours as needed for sleep (and/or anxiety).          . vitamin B-12 (CYANOCOBALAMIN) 1000 MCG tablet   Oral   Take 1,000 mcg by mouth daily.           Allergies:  Bee venom  Family History: Family History  Problem Relation Age of Onset  . Alzheimer's disease Mother   . Alzheimer's disease Sister   . Breast cancer Sister   . Melanoma Sister     Social History: Social History  Substance Use Topics  . Smoking  status: Never Smoker   . Smokeless tobacco: Not on file  . Alcohol Use: No     Comment: Patient denies      Review of Systems:   10 point review of systems was performed and was otherwise negative: Review of systems taken through the daughter Constitutional: No fever Eyes: No visual disturbances ENT: No sore throat, ear pain Cardiac: No chest pain Respiratory: No shortness of breath, wheezing, or stridor Abdomen: No abdominal pain, no vomiting, No diarrhea Endocrine: No weight loss, No night sweats Extremities: No peripheral edema, cyanosis Skin: No rashes, easy bruising Neurologic: No focal weakness, trouble with speech or swollowing Urologic: No dysuria, Hematuria, or urinary frequency   Physical Exam:  ED Triage Vitals  Enc Vitals Group     BP 11/17/15 2352 145/70 mmHg     Pulse Rate 11/17/15 2352 69     Resp 11/17/15 2352 14     Temp 11/17/15 2352 97.5 F (36.4 C)     Temp Source 11/17/15 2352 Oral     SpO2 11/17/15 2352 98 %     Weight 11/17/15 2352 150 lb (68.04 kg)     Height --      Head Cir --      Peak Flow --      Pain Score --      Pain Loc --      Pain Edu? --      Excl. in GC? --     General: Awake , Alert , and Oriented times 1, cooperative, somewhat lethargic (likely from medication.) Head: Normal cephalic , atraumatic Eyes: Pupils equal , round, reactive to light Nose/Throat: No nasal drainage, patent upper airway without erythema or exudate.  Neck: Supple, Full range of motion, No anterior adenopathy or palpable thyroid masses Lungs: Clear to ascultation without wheezes , rhonchi, or rales Heart: Regular rate, regular rhythm without murmurs , gallops , or rubs Abdomen: Soft, non tender without rebound, guarding , or rigidity; bowel sounds positive and symmetric in all 4 quadrants. No organomegaly .        Extremities: 2 plus symmetric pulses. No edema, clubbing or cyanosis Neurologic: , Motor symmetric without deficits, sensory intact patient's  able to squeeze with both hands  and lips both lower extremities up off the stretcher against resistance. Skin: warm, dry, no rashes   Labs:   All laboratory work was reviewed including any pertinent negatives or positives listed below:  Labs Reviewed  CBC WITH DIFFERENTIAL/PLATELET - Abnormal; Notable for the following:    Hemoglobin 11.7 (*)    All other components within normal limits  URINALYSIS COMPLETEWITH MICROSCOPIC ([facility] ONLY) - Abnormal; Notable for the following:    Color, Urine YELLOW (*)    APPearance CLEAR (*)    Ketones, ur TRACE (*)    All other components within normal limits  URINE CULTURE  COMPREHENSIVE METABOLIC PANEL  TSH  TROPONIN I    EKG:  ED ECG REPORT I, Jennye Moccasin, the attending physician, personally viewed and interpreted this ECG.  Date: 11/18/2015 EKG Time: 2354 Rate: 69 Rhythm: normal sinus rhythm QRS Axis: normal Intervals: normal ST/T Wave abnormalities: normal Conduction Disutrbances: none Narrative Interpretation: unremarkable    Radiology:  EXAM: RIGHT HAND - 2 VIEW  COMPARISON: None.  FINDINGS: Longitudinal cleft through the fifth proximal phalanx at the PIP may represent acute fracture. No dislocation. No other areas of suspicion for acute fracture. Moderately severe arthritic changes are present at the first [facility] joint. More moderate arthritic changes are present about most of the interphalangeal joints.  IMPRESSION: Probable intra-articular fracture of the fifth proximal phalanx at the PIP. No dislocation. No radiopaque foreign body.   Electronically Signed By: Ellery Plunk M.D. On: 11/18/2015 00:54          DG Chest 2 View (Final result) Result time: 11/18/15 00:52:06   Final result by Rad Results In Interface (11/18/15 00:52:06)   Narrative:   CLINICAL DATA: Altered mental status  EXAM: CHEST 2 VIEW  COMPARISON: 10/03/2015  FINDINGS: There is moderate cardiomegaly and aortic  tortuosity, unchanged. The lungs are clear. No pleural effusions. No pneumothorax.  IMPRESSION: Unchanged cardiomegaly. Acute findings.        I personally reviewed the radiologic studies    ED Course: * Patient has a small fracture at the distal tip of the proximal phalanx on her right hand. Patient had a hole or gutter splint applied due to her history of dementia felt to be more persistent and trying to buddy tape the patient's fingers together. She otherwise does not have any significant findings on evaluation that would explain her altered mental status other than her dementia progressing. I spoke to the daughter at home and the daughter was concerned that she would not be a little take care of her at home due to the agitation associated with the patient's dementia. I advised her that I would proceed with psychiatric consultation and further evaluation and possible eventual transfer to [facility] where the patient has been before in the past. Patient has Haldol written for as needed and has not required it up to this point.    Assessment:  Altered mental status Progressive dementia Status post fall with fifth digit nondisplaced fracture right hand     Plan: * Psychiatric consultation Patient's been otherwise medically cleared            Jennye Moccasin, MD 11/18/15 [phone]

## 2015-11-18 NOTE — ED Notes (Signed)
Pt repositioned in bed, given some water to drink. Pt is lethargic from Haldol injection at 245am..

## 2015-11-18 NOTE — ED Notes (Signed)
Pt assisted up to toilet for urination. Pt assisted back to bed.

## 2015-11-18 NOTE — ED Notes (Signed)
Pt brought to rm 20 from rm 10. Pt was for discharge but daughter wished to have pt evaluated by psych due to her not acting her normal self. Pt has dementia. Pt sleeping on arrival to rm 20. Resp even and unlabored.

## 2015-11-18 NOTE — ED Provider Notes (Signed)
-----------------------------------------   6:50 AM on 11/18/2015 -----------------------------------------   Blood pressure 141/74, pulse 70, temperature 97.5 F (36.4 C), temperature source Oral, resp. rate 16, weight 150 lb (68.04 kg), SpO2 100 %.  The patient had no acute events since last update.  Calm and cooperative at this time.  Disposition is pending per Psychiatry/Behavioral Medicine team recommendations.     Irean HongJade J Sung, MD 11/18/15 [phone]

## 2015-11-18 NOTE — [facility] Counselor (Signed)
2nd assessment attempt- unable to arouse, Pt mumbling incoherently

## 2015-11-18 NOTE — ED Notes (Signed)
Pt ate small amt of lunch. Was unable to hold the food herself d/t her casted rt hand

## 2015-11-18 NOTE — Discharge Instructions (Signed)
Alzheimer Disease Alzheimer disease is a mental disorder. It causes memory loss and loss of other mental functions, such as learning, thinking, problem solving, communicating, and completing tasks. The mental losses interfere with the ability to perform daily activities at work, at home, or in social situations. Alzheimer disease usually starts in a person's late 75s or early 7s but can start earlier in life (familial form). The mental changes caused by this disease are permanent and worsen over time. As the illness progresses, the ability to do even the simplest things is lost. Survival with Alzheimer disease ranges from several years to as long as 20 years. CAUSES Alzheimer disease is caused by abnormally high levels of a protein (beta-amyloid) in the brain. This protein forms very small deposits within and around the brain's nerve cells. These deposits prevent the nerve cells from working properly. Experts are not certain what causes the beta-amyloid deposits in this disease. RISK FACTORS The following major risk factors have been identified:  Increasing age.  Certain genetic variations, such as Down syndrome (trisomy 21). SYMPTOMS In the early stages of Alzheimer disease, you are still able to perform daily activities but need greater effort, more time, or memory aids. Early symptoms include:  Mild memory loss of recent events, names, or phone numbers.  Loss of objects.  Minor loss of vocabulary.  Difficulty with complex tasks, such as paying bills or driving in unfamiliar locations. Other mental functions deteriorate as the disease worsens. These changes slowly go from mild to severe. Symptoms at this stage include:  Difficulty remembering. You may not be able to recall personal information such as your address and telephone number. You may become confused about the date, the season of the year, or your location.  Difficulty maintaining attention. You may forget what you wanted to say  during conversations and repeat what you have already said.  Difficulty learning new information or tasks. You may not remember what you read or the name of a new friend you met.  Difficulty counting or doing math. You may have difficulty with complex math problems. You may make mistakes in paying bills or managing your checkbook.  Poor reasoning and judgment. You may make poor decisions or not dress right for the weather.  Difficulty communicating. You may have regular difficulty remembering words, naming objects, expressing yourself clearly, or writing sentences that make sense.  Difficulty performing familiar daily activities. You may get lost driving in familiar locations or need help eating, bathing, dressing, grooming, or using the toilet. You may have difficulty maintaining bladder or bowel control.  Difficulty recognizing familiar faces. You may confuse family members or close friends with one another. You may not recognize a close relative or may mistake strangers for family. Alzheimer disease also may cause changes in personality and behavior. These changes include:   Loss of interest or motivation.  Social withdrawal.  Anxiety.  Difficulty sleeping.  Uncharacteristic anger or combativeness.  A false belief that someone is trying to harm you (paranoia).  Seeing things that are not real (hallucinations).  Agitation. Confusion and disruptive behavior are often worse at night and may be triggered by changes in the environment or acute medical issues. DIAGNOSIS  Alzheimer disease is diagnosed through an assessment by your health care provider. During this assessment, your health care provider will do the following:  Ask you and your family, friends, or caregivers questions about your symptoms, their frequency, their duration and progression, and the effect they are having on your life.  Ask questions about your personal and family medical history and use of alcohol or drugs,  including prescription medicine.  Perform a physical exam and order blood tests and brain imaging exams. Your health care provider may refer you to a specialist for detailed evaluation of your mental functions (neuropsychological testing).  Many different brain disorders, medical conditions, and certain substances can cause symptoms that resemble Alzheimer disease symptoms. These must be ruled out before this disease can be diagnosed. If Alzheimer disease is diagnosed, it will be considered either "possible" or "probable" Alzheimer disease. "Possible" Alzheimer disease means that your symptoms are typical of the disease and no other disorder is causing them. "Probable" Alzheimer disease means that you also have a family history of the disease or genetic test results that support the diagnosis. Certain tests, mostly used in research studies, are highly specific for Alzheimer disease.  TREATMENT  There is currently no cure for this disease. The goals of treatment are to:  Slow down the progression of the disease.  Preserve mental function as long as possible.  Manage behavioral symptoms.  Make life easier for the person with Alzheimer disease and his or her caregivers. The following treatment options are available:  Medicine. Certain medicines may help slow memory loss by changing the level of certain chemicals in the brain. Medicine may also help with behavioral symptoms.  Talk therapy. Talk therapy provides education, support, and memory aids for people with this disease. It is most effective in the early stages of the illness.  Caregiving. Caregivers may be family members, friends, or trained medical professionals. They help the person with Alzheimer disease with daily life activities. Caregiving may take place at home or at a nursing facility.  Family support groups. These provide education, emotional support, and information about community resources to family members who are taking care of  the person with this disease.   This information is not intended to replace advice given to you by your health care provider. Make sure you discuss any questions you have with your health care provider.   Document Released: 08/13/2004 Document Revised: 12/23/2014 Document Reviewed: 04/09/2013 Elsevier Interactive Patient Education 2016 [place] or Splint Care Casts and splints support injured limbs and keep bones from moving while they heal. It is important to care for your cast or splint at home.  HOME CARE INSTRUCTIONS  Keep the cast or splint uncovered during the drying period. It can take 24 to 48 hours to dry if it is made of plaster. A fiberglass cast will dry in less than 1 hour.  Do not rest the cast on anything harder than a pillow for the first 24 hours.  Do not put weight on your injured limb or apply pressure to the cast until your health care provider gives you permission.  Keep the cast or splint dry. Wet casts or splints can lose their shape and may not support the limb as well. A wet cast that has lost its shape can also create harmful pressure on your skin when it dries. Also, wet skin can become infected.  Cover the cast or splint with a plastic bag when bathing or when out in the rain or snow. If the cast is on the trunk of the body, take sponge baths until the cast is removed.  If your cast does become wet, dry it with a towel or a blow dryer on the cool setting only.  Keep your cast or splint clean. Soiled casts may be  wiped with a moistened cloth.  Do not place any hard or soft foreign objects under your cast or splint, such as cotton, toilet paper, lotion, or powder.  Do not try to scratch the skin under the cast with any object. The object could get stuck inside the cast. Also, scratching could lead to an infection. If itching is a problem, use a blow dryer on a cool setting to relieve discomfort.  Do not trim or cut your cast or remove padding from  inside of it.  Exercise all joints next to the injury that are not immobilized by the cast or splint. For example, if you have a long leg cast, exercise the hip joint and toes. If you have an arm cast or splint, exercise the shoulder, elbow, thumb, and fingers.  Elevate your injured arm or leg on 1 or 2 pillows for the first 1 to 3 days to decrease swelling and pain.It is best if you can comfortably elevate your cast so it is higher than your heart. SEEK MEDICAL CARE IF:   Your cast or splint cracks.  Your cast or splint is too tight or too loose.  You have unbearable itching inside the cast.  Your cast becomes wet or develops a soft spot or area.  You have a bad smell coming from inside your cast.  You get an object stuck under your cast.  Your skin around the cast becomes red or raw.  You have new pain or worsening pain after the cast has been applied. SEEK IMMEDIATE MEDICAL CARE IF:   You have fluid leaking through the cast.  You are unable to move your fingers or toes.  You have discolored (blue or white), cool, painful, or very swollen fingers or toes beyond the cast.  You have tingling or numbness around the injured area.  You have severe pain or pressure under the cast.  You have any difficulty with your breathing or have shortness of breath.  You have chest pain.   This information is not intended to replace advice given to you by your health care provider. Make sure you discuss any questions you have with your health care provider.   Document Released: 11/29/2000 Document Revised: 09/22/2013 Document Reviewed: 06/10/2013 Elsevier Interactive Patient Education [employer].

## 2015-11-18 NOTE — ED Notes (Signed)
Daughter Diane in visiting with pt.

## 2015-11-18 NOTE — BH Assessment (Signed)
Assessment Note  Ruth Juarez is an 81 y.o. female Who presents to the ER via her daughter Sedalia Muta [phone]) due to having concerns about her behavior. Daughter states, she noticed a change in her, earlier this week. She thought it was a result from one of the pain medications she gave her. Patient was complaining of leg pain and wasn't walking well. After taking the medications, the patient started "talking out of her head, just a little bit." Daughter further explained, it progressed to her talking to the chairs and other objects in their home. At other times she was "yelling and hollering." This went on for two days. The patient had minimum sleep. This went on for approximately 48 hours. On Wednesday, the patient "cleared up" and was better. The patient and her daughter was able to go to store and complete a few other errands.   On yesterday, the patient started back "talking out of her head." She was also trying to get up and move around the house. She almost fell, several different times. These behaviors continued throughout the evening. Patient's daughter stated, she was unable to monitor her and make sure she was safe.  Thus, she called 911 and had EMS bring her to the ER to be checked out.  Patient originally lived in [place]. She moved to [facility], this past July. She moved in with her daughter, so she can help take care of her. Since the moved, she's been hospitalized two times, for psychiatric reasons. In July she was inpatient at [facility]. In August she was inpatient at [facility]. Daughter states she had improved a great deal and her current regiment of medications have worked, up till this point.  Writer made attempts to interview the patient but she was too sleepy and drowsy to participate.   Diagnosis: Alzheimer's dementia with behavioral disturbance (per history)  Past Medical History:  Past Medical History  Diagnosis Date  . Dementia   . Anxiety      Past Surgical History  Procedure Laterality Date  . Abdominal hysterectomy    . Tonsillectomy      Family History:  Family History  Problem Relation Age of Onset  . Alzheimer's disease Mother   . Alzheimer's disease Sister   . Breast cancer Sister   . Melanoma Sister     Social History:  reports that she has never smoked. She does not have any smokeless tobacco history on file. She reports that she does not drink alcohol or use illicit drugs.  Additional Social History:  Alcohol / Drug Use Pain Medications: See PTA Prescriptions: See PTA Over the Counter: See PTA History of alcohol / drug use?: No history of alcohol / drug abuse Longest period of sobriety (when/how long):  (No Abuse Reported) Negative Consequences of Use:  (No Abuse Reported) Withdrawal Symptoms:  (No Abuse Reported)  CIWA: CIWA-Ar BP: (!) 141/74 mmHg Pulse Rate: 70 COWS:    Allergies:  Allergies  Allergen Reactions  . Bee Venom Swelling    Home Medications:  (Not in a hospital admission)  OB/GYN Status:  No LMP recorded. Patient is postmenopausal.  General Assessment Data Location of Assessment: [facility] ED TTS Assessment: In system Is this a Tele or Face-to-Face Assessment?: Face-to-Face Is this an Initial Assessment or a Re-assessment for this encounter?: Initial Assessment Marital status: Divorced Posen name: n/a Is patient pregnant?: No Pregnancy Status: No Living Arrangements: Children Can pt return to current living arrangement?: Yes Admission Status: Voluntary Is patient capable of  signing voluntary admission?: Yes Referral Source: Self/Family/Friend Insurance type: Medicare  Medical Screening Exam [facility] Walk-in ONLY) Medical Exam completed: Yes  Crisis Care Plan Living Arrangements: Children Name of Psychiatrist: None Name of Therapist: None  Education Status Is patient currently in school?: No Current Grade: n/a Highest grade of school patient has completed:  Unknown Name of school: n/a Contact person: n/a  Risk to self with the past 6 months Suicidal Ideation: No Has patient been a risk to self within the past 6 months prior to admission? : No Suicidal Intent: No Has patient had any suicidal intent within the past 6 months prior to admission? : No Is patient at risk for suicide?: No Suicidal Plan?: No Has patient had any suicidal plan within the past 6 months prior to admission? : No Access to Means: No What has been your use of drugs/alcohol within the last 12 months?: None Reported Previous Attempts/Gestures: No How many times?: 0 Other Self Harm Risks: None Reported Triggers for Past Attempts: None known Intentional Self Injurious Behavior: None Family Suicide History: Unknown Recent stressful life event(s): Other (Comment) (Dementia) Persecutory voices/beliefs?: No Depression: No Depression Symptoms: Despondent Substance abuse history and/or treatment for substance abuse?: No Suicide prevention information given to non-admitted patients: Not applicable  Risk to Others within the past 6 months Homicidal Ideation: No Does patient have any lifetime risk of violence toward others beyond the six months prior to admission? : No Thoughts of Harm to Others: No Current Homicidal Intent: No Current Homicidal Plan: No Access to Homicidal Means: No Identified Victim: None Reported History of harm to others?: No Assessment of Violence: None Noted Violent Behavior Description: None Reported Does patient have access to weapons?: No Criminal Charges Pending?: No Does patient have a court date: No Is patient on probation?: No  Psychosis Hallucinations: None noted Delusions: Unspecified  Mental Status Report Appearance/Hygiene: In scrubs, Unremarkable, In hospital gown Eye Contact: Poor Motor Activity: Unable to assess (Patient lying in beds) Speech: Incoherent, Slurred, Soft Level of Consciousness: Drowsy, Sleeping Mood:   (UTA) Affect: Unable to Assess Anxiety Level:  (UTA) Thought Processes: Unable to Assess Judgement: Unable to Assess Orientation: Unable to assess Obsessive Compulsive Thoughts/Behaviors: Unable to Assess  Cognitive Functioning Concentration: Unable to Assess Memory: Unable to Assess Insight: Unable to Assess Impulse Control: Unable to Assess Appetite: Fair (Per Daughter) Weight Loss: 0 Weight Gain: 0 Sleep: Decreased Total Hours of Sleep: 2 Vegetative Symptoms: None  ADLScreening [facility] Assessment Services) Patient's cognitive ability adequate to safely complete daily activities?: No Patient able to express need for assistance with ADLs?: Yes Independently performs ADLs?: No (Daughter is primary her caregiver.)  Prior Inpatient Therapy Prior Inpatient Therapy: Yes Prior Therapy Dates: 06/2015 &  07/2015 Prior Therapy Facilty/Provider(s): [facility] Reason for Treatment: Alzheimer's dementia with behavioral disturbance  Prior Outpatient Therapy Prior Outpatient Therapy: Yes Prior Therapy Dates: Current Prior Therapy Facilty/Provider(s): [facility] Reason for Treatment: Medical, PCP Does patient have an ACCT team?: No Does patient have Intensive In-House Services?  : No Does patient have [facility] services? : No Does patient have P4CC services?: No  ADL Screening (condition at time of admission) Patient's cognitive ability adequate to safely complete daily activities?: No Is the patient deaf or have difficulty hearing?: No Does the patient have difficulty seeing, even when wearing glasses/contacts?: Yes Does the patient have difficulty concentrating, remembering, or making decisions?: Yes Patient able to express need for assistance with ADLs?: Yes Does the patient have difficulty dressing or  bathing?: No Independently performs ADLs?: No (Daughter is primary her caregiver.) Does the patient have difficulty walking or climbing stairs?: Yes Weakness of Legs:  Both Weakness of Arms/Hands: Both       Abuse/Neglect Assessment (Assessment to be complete while patient is alone) Physical Abuse: Denies Verbal Abuse: Denies Sexual Abuse: Denies Exploitation of patient/patient's resources: Denies Self-Neglect: Denies Values / Beliefs Cultural Requests During Hospitalization: None Spiritual Requests During Hospitalization: None Consults Spiritual Care Consult Needed: No Social Work Consult Needed: No      Additional Information 1:1 In Past 12 Months?: No CIRT Risk: No Elopement Risk: No Does patient have medical clearance?: Yes  Child/Adolescent Assessment Running Away Risk: Denies (Patient is an adult)  Disposition:  Disposition Initial Assessment Completed for this Encounter: Yes Disposition of Patient: Other dispositions (Psych MD to see) Other disposition(s): Other (Comment) (Psych MD to see)  On Site Evaluation by:   Reviewed with Physician:     Lilyan Gilfordalvin J. Adalin Vanderploeg, MS, LCAS, LPC, NCC, CCSI 11/18/2015 10:45 AM

## 2015-11-18 NOTE — ED Notes (Signed)
Pt attempting to get out of bed repeatedly. Ed tech in to sit with pt.

## 2015-11-18 NOTE — ED Notes (Signed)
Pt meal given, pt remains asleep with no distress noted.

## 2015-11-18 NOTE — ED Provider Notes (Signed)
-----------------------------------------   2:38 PM on 11/18/2015 -----------------------------------------  Care discussed with daughter after psychiatry evaluation. No medicine changes at this time. Follow-up with psychiatry and primary care. She now calm and comfortable, resume usual medication regimen. No acute medical issues at this time, vital signs normal.  Sharman CheekPhillip Vansh Reckart, MD 11/18/15 1438

## 2015-11-18 NOTE — ED Notes (Signed)
Report to ann, rn. Pt to room 20.

## 2015-11-18 NOTE — ED Notes (Signed)
BEHAVIORAL HEALTH ROUNDING Patient sleeping: Yes.   Patient alert and oriented: not applicable SLEEPING Behavior appropriate: Yes.  ; If no, describe: SLEEPING Nutrition and fluids offered: No SLEEPING Toileting and hygiene offered: NoSLEEPING Sitter present: not applicable Law enforcement present: Yes ODS 

## 2015-11-18 NOTE — ED Notes (Signed)
Patient is resting comfortably. Respirations WNL, skin is warm and dry..Marland Kitchen

## 2015-11-18 NOTE — Consult Note (Signed)
[facility] Face-to-Face Psychiatry Consult   Reason for Consult:  Dementia with hallucinations Referring Physician:  Daymon Larsen, MD  Patient Identification: Ruth Juarez MRN:  [id number] Principal Diagnosis: <principal problem not specified> Diagnosis:   Patient Active Problem List   Diagnosis Date Noted  . TIA (transient ischemic attack) [G45.9] 10/03/2015  . Alzheimer's dementia with behavioral disturbance [G30.8] 07/03/2015  . Urinary tract infection [N39.0] 07/03/2015    Total Time spent with patient: 20 minutes  Subjective:   Ruth Juarez is a 81 y.o. female patient who presents to [facility] ED for evaluation of 3 days of generalized weakness with some hallucinations. Pt's daughter described to Dr. Marcelene Butte that the pt sees people who are present with some situational hallucinations. The pt experienced similar hallucinations in the past lasting one day. The pt had some minor falls in the past without significant head trauma per her daughter.  Today on interview, the pt was sleeping calmly in her room in the bed. Pt is arousable to touch and responded to this provider by saying "Good morning" to this provider. When asked how she was doing, she stated, "I'm better." Pt did not intelligibly answer further questions. Pt has not been a behavioral issue since being here in the ED.   Per [facility] Assessment Counselor's discussion with family, the pt experienced increased generalized weakness starting 11-13-15. The pt was provided pain medications by her daughter with whom she lives. Pt had issues with talking to items in the home and was not sleeping on November 29-30, 2016. She began to improve by later on 11-15-2015 but then generalized weakness progressed by December 1-2, 2016. The pt's daughter shared the pt had several minor falls and believed the pt was hallucinating requiring medical evaluation.   Lab results demonstrated no evidence of UTI but a urine culture is pending. WBC with  differential was unremarkable except for a slightly low hemoglobin of 11.7. Hemoglobin about 1 month ago was wnL at 13.  CMP demonstrated an elevated creatinine with other hepatic abnormalities noted. TSH is wnL. Troponin was wnL as well.  Unable to assess pt for SI, HI and AVH.    11-18-2015 CT Head demonstrated the following:  No acute intracranial process. Moderate chronic small vessel ischemic disease. Old LEFT basal ganglia lacunar infarct. Involutional changes.   Past Psychiatric History:  Depression Major Neurocognitive disorder, Alzheimer's type  Psychiatric hospitalizations: [facility] from August 8-17, 2016 [facility] from July 22-25, 2016  Risk to Self:   Risk to Others:   Prior Inpatient Therapy:   Prior Outpatient Therapy:    Past Medical History:  Past Medical History  Diagnosis Date  . Dementia   . Anxiety     Past Surgical History  Procedure Laterality Date  . Abdominal hysterectomy    . Tonsillectomy     Family History:  Family History  Problem Relation Age of Onset  . Alzheimer's disease Mother   . Alzheimer's disease Sister   . Breast cancer Sister   . Melanoma Sister    Family Psychiatric  History:  Not obtainable from patient.   Social History:  History  Alcohol Use No    Comment: Patient denies      History  Drug Use No    Comment: Patient denies    Social History   Social History  . Marital Status: Widowed    Spouse Name: N/A  . Number of Children: N/A  . Years of Education: N/A  Social History Main Topics  . Smoking status: Never Smoker   . Smokeless tobacco: Not on file  . Alcohol Use: No     Comment: Patient denies   . Drug Use: No     Comment: Patient denies  . Sexual Activity: No   Other Topics Concern  . Not on file   Social History Narrative   Additional Social History: Moved to [place] from [facility] in July 2016 due to inability to care for herself and dementia.     Allergies:   Allergies  Allergen Reactions  . Bee Venom Swelling    Labs:  Results for orders placed or performed during the hospital encounter of 11/17/15 (from the past 48 hour(s))  CBC with Differential/Platelet     Status: Abnormal   Collection Time: 11/18/15 12:12 AM  Result Value Ref Range   WBC 6.3 3.6 - 11.0 K/uL   RBC 4.23 3.80 - 5.20 MIL/uL   Hemoglobin 11.7 (L) 12.0 - 16.0 g/dL   HCT 36.6 35.0 - 47.0 %   MCV 86.5 80.0 - 100.0 fL   MCH 27.7 26.0 - 34.0 pg   MCHC 32.1 32.0 - 36.0 g/dL   RDW 14.4 11.5 - 14.5 %   Platelets 220 150 - 440 K/uL   Neutrophils Relative % 62 %   Neutro Abs 4.0 1.4 - 6.5 K/uL   Lymphocytes Relative 21 %   Lymphs Abs 1.3 1.0 - 3.6 K/uL   Monocytes Relative 13 %   Monocytes Absolute 0.8 0.2 - 0.9 K/uL   Eosinophils Relative 3 %   Eosinophils Absolute 0.2 0 - 0.7 K/uL   Basophils Relative 1 %   Basophils Absolute 0.0 0 - 0.1 K/uL  Comprehensive metabolic panel     Status: Abnormal   Collection Time: 11/18/15 12:12 AM  Result Value Ref Range   Sodium 142 135 - 145 mmol/L   Potassium 3.7 3.5 - 5.1 mmol/L   Chloride 108 101 - 111 mmol/L   CO2 28 22 - 32 mmol/L   Glucose, Bld 96 65 - 99 mg/dL   BUN 29 (H) 6 - 20 mg/dL   Creatinine, Ser 0.72 0.44 - 1.00 mg/dL   Calcium 8.9 8.9 - 10.3 mg/dL   Total Protein 6.4 (L) 6.5 - 8.1 g/dL   Albumin 3.1 (L) 3.5 - 5.0 g/dL   AST 16 15 - 41 U/L   ALT 11 (L) 14 - 54 U/L   Alkaline Phosphatase 36 (L) 38 - 126 U/L   Total Bilirubin 0.2 (L) 0.3 - 1.2 mg/dL   GFR calc non Af Amer >60 >60 mL/min   GFR calc Af Amer >60 >60 mL/min    Comment: (NOTE) The eGFR has been calculated using the CKD EPI equation. This calculation has not been validated in all clinical situations. eGFR's persistently <60 mL/min signify possible Chronic Kidney Disease.    Anion gap 6 5 - 15  Urinalysis complete, with microscopic ([facility] only)     Status: Abnormal   Collection Time: 11/18/15 12:12 AM  Result Value Ref Range    Color, Urine YELLOW (A) YELLOW   APPearance CLEAR (A) CLEAR   Glucose, UA NEGATIVE NEGATIVE mg/dL   Bilirubin Urine NEGATIVE NEGATIVE   Ketones, ur TRACE (A) NEGATIVE mg/dL   Specific Gravity, Urine 1.013 1.005 - 1.030   Hgb urine dipstick NEGATIVE NEGATIVE   pH 7.0 5.0 - 8.0   Protein, ur NEGATIVE NEGATIVE mg/dL   Nitrite NEGATIVE NEGATIVE   Leukocytes, UA  NEGATIVE NEGATIVE   RBC / HPF 0-5 0 - 5 RBC/hpf   WBC, UA 0-5 0 - 5 WBC/hpf   Bacteria, UA NONE SEEN NONE SEEN   Squamous Epithelial / LPF NONE SEEN NONE SEEN   Mucous PRESENT   TSH     Status: None   Collection Time: 11/18/15 12:12 AM  Result Value Ref Range   TSH 2.770 0.350 - 4.500 uIU/mL  Troponin I     Status: None   Collection Time: 11/18/15 12:12 AM  Result Value Ref Range   Troponin I <0.03 <0.031 ng/mL    Comment:        NO INDICATION OF MYOCARDIAL INJURY.     Current Facility-Administered Medications  Medication Dose Route Frequency Provider Last Rate Last Dose  . haloperidol lactate (HALDOL) injection 5 mg  5 mg Intramuscular Q8H PRN Daymon Larsen, MD       Current Outpatient Prescriptions  Medication Sig Dispense Refill  . acetaminophen (TYLENOL) 325 MG tablet Take 325 mg by mouth every 6 (six) hours as needed for mild pain or headache.     Marland Kitchen aspirin 81 MG chewable tablet Chew 81 mg by mouth daily.    . calcium carbonate (TUMS - DOSED IN MG ELEMENTAL CALCIUM) 500 MG chewable tablet Chew 1 tablet by mouth as needed for indigestion or heartburn.     . clotrimazole (LOTRIMIN) 1 % cream Apply 1 application topically every 8 (eight) hours as needed (for irritation on skin).    Marland Kitchen divalproex (DEPAKOTE ER) 250 MG 24 hr tablet Take 250 mg by mouth 2 (two) times daily.    Marland Kitchen escitalopram (LEXAPRO) 10 MG tablet Take 10 mg by mouth at bedtime.    . Melatonin 1 MG TABS Take 1 mg by mouth at bedtime as needed (for sleep).    . mirtazapine (REMERON) 15 MG tablet Take 15 mg by mouth at bedtime.    . Multiple Vitamin  (MULTIVITAMIN WITH MINERALS) TABS tablet Take 1 tablet by mouth daily.    . risperiDONE (RISPERDAL) 0.5 MG tablet Take 1 tablet (0.5 mg total) by mouth 2 (two) times daily as needed (agitation). (Patient not taking: Reported on 10/03/2015) 60 tablet 0  . risperiDONE (RISPERDAL) 1 MG tablet Take 1-2 mg by mouth 2 (two) times daily. Pt takes one tablet in the morning and two tablets at bedtime.    . traMADol (ULTRAM) 50 MG tablet Take 25-50 mg by mouth 2 (two) times daily as needed for moderate pain.    . traZODone (DESYREL) 50 MG tablet Take 25-50 mg by mouth every 4 (four) hours as needed for sleep (and/or anxiety).     . vitamin B-12 (CYANOCOBALAMIN) 1000 MCG tablet Take 1,000 mcg by mouth daily.      Musculoskeletal: Strength & Muscle Tone: decreased Gait & Station: not assessed due to pt's dementia status Patient leans: not assessed due to pt's dementia status  Psychiatric Specialty Exam: Review of Systems  Unable to perform ROS: dementia    Blood pressure 141/74, pulse 70, temperature 97.5 F (36.4 C), temperature source Oral, resp. rate 16, weight 68.04 kg (150 lb), SpO2 100 %.Body mass index is 27.43 kg/(m^2).  General Appearance: Casual and Fairly Groomed  Engineer, water::  Poor  Speech:  Garbled  Volume:  Normal  Mood:  not assessed due to pt's dementia status  Affect:  Flat  Thought Process:  not assessed due to pt's dementia status  Orientation:  Other:  not assessed due to  pt's dementia status  Thought Content:  not assessed due to pt's dementia status  Suicidal Thoughts:  not assessed due to pt's dementia status  Homicidal Thoughts:  not assessed due to pt's dementia status  Memory:  Impaired due to dementia  Judgement:  Impaired  Insight:  Lacking  Psychomotor Activity:  Decreased  Concentration:  Poor  Recall:  not assessed due to pt's dementia status  [place] assessed due to pt's dementia status  Language: fair to poor  Akathisia:  No  Handed:  not  assessed due to pt's dementia status  AIMS (if indicated):  N/A  Assets:  Housing Social Support  ADL's:  Impaired  Cognition: Impaired,  Severe  Sleep:  normal   Treatment Plan Summary: Plan Discharge to home  Disposition: No evidence of imminent risk to self or others at present.   Patient does not meet criteria for psychiatric inpatient admission. Discussed crisis plan, support from social network, calling 911, coming to the Emergency Department, and calling Suicide Hotline. Will not change medications at this time.    Donita Brooks 11/18/2015 9:37 AM

## 2015-11-18 NOTE — ED Notes (Signed)
Psychiatrist and TTS in with pt.

## 2015-11-18 NOTE — ED Notes (Signed)
Report received from TremontAnn C.

## 2015-11-18 NOTE — ED Notes (Signed)
Pt's daughter numer [phone] and [phone]

## 2015-11-18 NOTE — ED Notes (Signed)
Pt sleeping. 

## 2015-11-20 LAB — URINE CULTURE: Culture: 1000

## 2015-12-26 ENCOUNTER — Encounter: Payer: Self-pay | Admitting: Specialist

## 2015-12-26 ENCOUNTER — Observation Stay
Admission: EM | Admit: 2015-12-26 | Discharge: 2015-12-28 | Disposition: A | Discharge status: Home / Self Care | Payer: Medicare Other | Attending: Internal Medicine | Admitting: Internal Medicine

## 2015-12-26 ENCOUNTER — Emergency Department: Payer: Medicare Other

## 2015-12-26 DIAGNOSIS — Z7982 Long term (current) use of aspirin: Secondary | ICD-10-CM | POA: Diagnosis not present

## 2015-12-26 DIAGNOSIS — R262 Difficulty in walking, not elsewhere classified: Secondary | ICD-10-CM | POA: Insufficient documentation

## 2015-12-26 DIAGNOSIS — B9689 Other specified bacterial agents as the cause of diseases classified elsewhere: Secondary | ICD-10-CM | POA: Diagnosis not present

## 2015-12-26 DIAGNOSIS — R531 Weakness: Secondary | ICD-10-CM | POA: Diagnosis present

## 2015-12-26 DIAGNOSIS — N179 Acute kidney failure, unspecified: Secondary | ICD-10-CM | POA: Diagnosis not present

## 2015-12-26 DIAGNOSIS — E860 Dehydration: Secondary | ICD-10-CM | POA: Diagnosis not present

## 2015-12-26 DIAGNOSIS — Z8673 Personal history of transient ischemic attack (TIA), and cerebral infarction without residual deficits: Secondary | ICD-10-CM | POA: Insufficient documentation

## 2015-12-26 DIAGNOSIS — R5383 Other fatigue: Secondary | ICD-10-CM | POA: Insufficient documentation

## 2015-12-26 DIAGNOSIS — F0281 Dementia in other diseases classified elsewhere with behavioral disturbance: Secondary | ICD-10-CM | POA: Diagnosis not present

## 2015-12-26 DIAGNOSIS — R001 Bradycardia, unspecified: Secondary | ICD-10-CM | POA: Insufficient documentation

## 2015-12-26 DIAGNOSIS — F419 Anxiety disorder, unspecified: Secondary | ICD-10-CM | POA: Insufficient documentation

## 2015-12-26 DIAGNOSIS — G309 Alzheimer's disease, unspecified: Secondary | ICD-10-CM | POA: Diagnosis not present

## 2015-12-26 DIAGNOSIS — F329 Major depressive disorder, single episode, unspecified: Secondary | ICD-10-CM | POA: Diagnosis not present

## 2015-12-26 DIAGNOSIS — R4182 Altered mental status, unspecified: Secondary | ICD-10-CM | POA: Diagnosis not present

## 2015-12-26 DIAGNOSIS — G9341 Metabolic encephalopathy: Secondary | ICD-10-CM | POA: Diagnosis not present

## 2015-12-26 DIAGNOSIS — Z79899 Other long term (current) drug therapy: Secondary | ICD-10-CM | POA: Diagnosis not present

## 2015-12-26 DIAGNOSIS — Z91030 Bee allergy status: Secondary | ICD-10-CM | POA: Insufficient documentation

## 2015-12-26 DIAGNOSIS — N390 Urinary tract infection, site not specified: Secondary | ICD-10-CM | POA: Insufficient documentation

## 2015-12-26 LAB — URINALYSIS COMPLETE WITH MICROSCOPIC (ARMC ONLY)
Bilirubin Urine: NEGATIVE
Glucose, UA: NEGATIVE mg/dL
Hgb urine dipstick: NEGATIVE
NITRITE: NEGATIVE
PH: 6 (ref 5.0–8.0)
PROTEIN: NEGATIVE mg/dL
SPECIFIC GRAVITY, URINE: 1.012 (ref 1.005–1.030)

## 2015-12-26 LAB — GLUCOSE, CAPILLARY: Glucose-Capillary: 118 mg/dL — ABNORMAL HIGH (ref 65–99)

## 2015-12-26 LAB — COMPREHENSIVE METABOLIC PANEL
ALBUMIN: 3.3 g/dL — AB (ref 3.5–5.0)
ALK PHOS: 46 U/L (ref 38–126)
ALT: 11 U/L — ABNORMAL LOW (ref 14–54)
ANION GAP: 7 (ref 5–15)
AST: 19 U/L (ref 15–41)
BUN: 29 mg/dL — ABNORMAL HIGH (ref 6–20)
CALCIUM: 9.1 mg/dL (ref 8.9–10.3)
CO2: 30 mmol/L (ref 22–32)
Chloride: 103 mmol/L (ref 101–111)
Creatinine, Ser: 1.07 mg/dL — ABNORMAL HIGH (ref 0.44–1.00)
GFR calc non Af Amer: 46 mL/min — ABNORMAL LOW (ref >60)
GFR, EST AFRICAN AMERICAN: 53 mL/min — AB (ref >60)
Glucose, Bld: 137 mg/dL — ABNORMAL HIGH (ref 65–99)
POTASSIUM: 4 mmol/L (ref 3.5–5.1)
SODIUM: 140 mmol/L (ref 135–145)
Total Bilirubin: 0.6 mg/dL (ref 0.3–1.2)
Total Protein: 7.2 g/dL (ref 6.5–8.1)

## 2015-12-26 LAB — TROPONIN I: Troponin I: <0.03 ng/mL (ref <0.031)

## 2015-12-26 LAB — CBC
HEMATOCRIT: 43.4 % (ref 35.0–47.0)
HEMOGLOBIN: 14 g/dL (ref 12.0–16.0)
MCH: 27.1 pg (ref 26.0–34.0)
MCHC: 32.2 g/dL (ref 32.0–36.0)
MCV: 84.2 fL (ref 80.0–100.0)
Platelets: 251 10*3/uL (ref 150–440)
RBC: 5.16 MIL/uL (ref 3.80–5.20)
RDW: 14.3 % (ref 11.5–14.5)
WBC: 11.2 10*3/uL — ABNORMAL HIGH (ref 3.6–11.0)

## 2015-12-26 MED ORDER — ACETAMINOPHEN 325 MG PO TABS: 325.0000 mg | ORAL_TABLET | Freq: Four times a day (QID) | ORAL | Status: DC | PRN

## 2015-12-26 MED ORDER — MELATONIN 1 MG PO TABS: 1.0000 mg | ORAL_TABLET | Freq: Every evening | ORAL | Status: DC | PRN

## 2015-12-26 MED ORDER — MIRTAZAPINE 15 MG PO TABS
15.0000 mg | ORAL_TABLET | Freq: Every day | ORAL | Status: DC
  Administered 2015-12-27 (×2): 15 mg via ORAL
  Filled 2015-12-26 (×2): qty 1

## 2015-12-26 MED ORDER — ACETAMINOPHEN 650 MG RE SUPP: 650.0000 mg | Freq: Four times a day (QID) | RECTAL | Status: DC | PRN

## 2015-12-26 MED ORDER — SODIUM CHLORIDE 0.9 % IV SOLN
INTRAVENOUS | Status: DC
  Administered 2015-12-26 – 2015-12-27 (×3): via INTRAVENOUS

## 2015-12-26 MED ORDER — ASPIRIN 81 MG PO CHEW
81.0000 mg | CHEWABLE_TABLET | Freq: Every day | ORAL | Status: DC
  Administered 2015-12-27 – 2015-12-28 (×3): 81 mg via ORAL
  Filled 2015-12-26 (×3): qty 1

## 2015-12-26 MED ORDER — DIVALPROEX SODIUM ER 250 MG PO TB24
250.0000 mg | ORAL_TABLET | Freq: Two times a day (BID) | ORAL | Status: DC
  Administered 2015-12-27 – 2015-12-28 (×4): 250 mg via ORAL
  Filled 2015-12-26 (×6): qty 1

## 2015-12-26 MED ORDER — VITAMIN B-12 1000 MCG PO TABS
1000.0000 ug | ORAL_TABLET | Freq: Every day | ORAL | Status: DC
  Administered 2015-12-27 – 2015-12-28 (×3): 1000 ug via ORAL
  Filled 2015-12-26 (×3): qty 1

## 2015-12-26 MED ORDER — RISPERIDONE 0.5 MG PO TABS: 1.0000 mg | ORAL_TABLET | Freq: Two times a day (BID) | ORAL | Status: DC

## 2015-12-26 MED ORDER — ESCITALOPRAM OXALATE 10 MG PO TABS
10.0000 mg | ORAL_TABLET | Freq: Every day | ORAL | Status: DC
  Administered 2015-12-27 (×2): 10 mg via ORAL
  Filled 2015-12-26 (×2): qty 1

## 2015-12-26 MED ORDER — TRAMADOL HCL 50 MG PO TABS
25.0000 mg | ORAL_TABLET | Freq: Two times a day (BID) | ORAL | Status: DC | PRN
Start: 2015-12-26 — End: 2015-12-27

## 2015-12-26 MED ORDER — ENOXAPARIN SODIUM 40 MG/0.4ML ~~LOC~~ SOLN
40.0000 mg | SUBCUTANEOUS | Status: DC
  Administered 2015-12-27 (×2): 40 mg via SUBCUTANEOUS
  Filled 2015-12-26 (×2): qty 0.4

## 2015-12-26 MED ORDER — ONDANSETRON HCL 4 MG PO TABS: 4.0000 mg | ORAL_TABLET | Freq: Four times a day (QID) | ORAL | Status: DC | PRN

## 2015-12-26 MED ORDER — ONDANSETRON HCL 4 MG/2ML IJ SOLN: 4.0000 mg | Freq: Four times a day (QID) | INTRAMUSCULAR | Status: DC | PRN

## 2015-12-26 MED ORDER — RISPERIDONE 0.5 MG PO TABS
1.0000 mg | ORAL_TABLET | Freq: Every morning | ORAL | Status: DC
  Administered 2015-12-27 – 2015-12-28 (×2): 1 mg via ORAL
  Filled 2015-12-26 (×3): qty 2

## 2015-12-26 MED ORDER — CALCIUM CARBONATE ANTACID 500 MG PO CHEW: 1.0000 | CHEWABLE_TABLET | ORAL | Status: DC | PRN

## 2015-12-26 MED ORDER — CEFTRIAXONE SODIUM 1 G IJ SOLR
1.0000 g | INTRAMUSCULAR | Status: DC
  Administered 2015-12-26: 1 g via INTRAVENOUS
  Filled 2015-12-26 (×2): qty 10

## 2015-12-26 MED ORDER — ADULT MULTIVITAMIN W/MINERALS CH
1.0000 | ORAL_TABLET | Freq: Every day | ORAL | Status: DC
  Administered 2015-12-27 – 2015-12-28 (×3): 1 via ORAL
  Filled 2015-12-26 (×3): qty 1

## 2015-12-26 MED ORDER — RISPERIDONE 0.5 MG PO TABS
2.0000 mg | ORAL_TABLET | Freq: Every day | ORAL | Status: DC
  Administered 2015-12-27 (×2): 2 mg via ORAL
  Filled 2015-12-26: qty 4

## 2015-12-26 MED ORDER — ACETAMINOPHEN 325 MG PO TABS: 650.0000 mg | ORAL_TABLET | Freq: Four times a day (QID) | ORAL | Status: DC | PRN

## 2015-12-26 MED ORDER — LORAZEPAM 0.5 MG PO TABS: 0.5000 mg | ORAL_TABLET | Freq: Four times a day (QID) | ORAL | Status: DC | PRN

## 2015-12-26 MED ORDER — TRAZODONE HCL 50 MG PO TABS
25.0000 mg | ORAL_TABLET | ORAL | Status: DC | PRN
Start: 2015-12-26 — End: 2015-12-28
  Administered 2015-12-26: 50 mg via ORAL
  Administered 2015-12-28: 25 mg via ORAL
  Filled 2015-12-26 (×2): qty 1

## 2015-12-26 MED ORDER — SENNOSIDES-DOCUSATE SODIUM 8.6-50 MG PO TABS
1.0000 | ORAL_TABLET | Freq: Two times a day (BID) | ORAL | Status: DC
  Administered 2015-12-27 – 2015-12-28 (×4): 1 via ORAL
  Filled 2015-12-26 (×4): qty 1

## 2015-12-26 MED ORDER — DEXTROSE 5 % IV SOLN
1.0000 g | INTRAVENOUS | Status: DC
  Administered 2015-12-27: 1 g via INTRAVENOUS
  Filled 2015-12-26: qty 10

## 2015-12-26 NOTE — ED Notes (Signed)
Patient is resting comfortably, daughter at bedside.   

## 2015-12-26 NOTE — H&P (Addendum)
[facility] [place] at [facility]   PATIENT NAME: Ruth Juarez    MR#:  [id number]  DATE OF BIRTH:  1927-07-18  DATE OF ADMISSION:  12/26/2015  PRIMARY CARE PHYSICIAN: [facility]   REQUESTING/REFERRING PHYSICIAN: Dr. Minna Antis  CHIEF COMPLAINT:   Chief Complaint  Patient presents with  . Altered Mental Status  . Weakness    HISTORY OF PRESENT ILLNESS:  Ruth Juarez  is a 82 y.o. female with a known history of dementia, depression/anxiety who presents to the hospital brought in by her daughter due to altered mental status and weakness. Patient herself is a very poor historian therefore most of the history obtained from the daughter at bedside. As per the daughter patient has been more weak and lethargic over the past few days. Her appetite has been poor and she has not been able to ambulate even while using a walker. Her daughter noticed that her urine had a very foul order and thought she had a bladder infection and brought her to the emergency room for further evaluation. She was noted to have urinary tract infection and hospitalist services were contacted further treatment and evaluation.  PAST MEDICAL HISTORY:   Past Medical History  Diagnosis Date  . Dementia   . Anxiety     PAST SURGICAL HISTORY:   Past Surgical History  Procedure Laterality Date  . Abdominal hysterectomy    . Tonsillectomy      SOCIAL HISTORY:   Social History  Substance Use Topics  . Smoking status: Never Smoker   . Smokeless tobacco: Not on file  . Alcohol Use: No     Comment: Patient denies     FAMILY HISTORY:   Family History  Problem Relation Age of Onset  . Alzheimer's disease Mother   . Alzheimer's disease Sister   . Breast cancer Sister   . Melanoma Sister     DRUG ALLERGIES:   Allergies  Allergen Reactions  . Bee Venom Swelling    REVIEW OF SYSTEMS:   Review of Systems  Unable to perform ROS: dementia     MEDICATIONS AT HOME:   Prior to Admission medications   Medication Sig Start Date End Date Taking? Authorizing Provider  acetaminophen (TYLENOL) 325 MG tablet Take 325 mg by mouth every 6 (six) hours as needed for mild pain or headache.    Yes Historical Provider, MD  aspirin 81 MG chewable tablet Chew 81 mg by mouth daily.   Yes Historical Provider, MD  calcium carbonate (TUMS - DOSED IN MG ELEMENTAL CALCIUM) 500 MG chewable tablet Chew 1 tablet by mouth as needed for indigestion or heartburn.    Yes Historical Provider, MD  clotrimazole (LOTRIMIN) 1 % cream Apply 1 application topically every 8 (eight) hours as needed (for skin irritation).    Yes Historical Provider, MD  divalproex (DEPAKOTE ER) 250 MG 24 hr tablet Take 250 mg by mouth 2 (two) times daily.   Yes Historical Provider, MD  escitalopram (LEXAPRO) 10 MG tablet Take 10 mg by mouth at bedtime.   Yes Historical Provider, MD  LORazepam (ATIVAN) 0.5 MG tablet Take 1 tablet (0.5 mg total) by mouth every 6 (six) hours as needed for anxiety. 11/18/15 11/17/16 Yes Sharman Cheek, MD  Melatonin 1 MG TABS Take 1 mg by mouth at bedtime as needed (for sleep).   Yes Historical Provider, MD  mirtazapine (REMERON) 15 MG tablet Take 15 mg by mouth at bedtime.   Yes Historical  Provider, MD  Multiple Vitamin (MULTIVITAMIN WITH MINERALS) TABS tablet Take 1 tablet by mouth daily.   Yes Historical Provider, MD  risperiDONE (RISPERDAL) 1 MG tablet Take 1-2 mg by mouth 2 (two) times daily. Pt takes one tablet in the morning and two tablets at bedtime.   Yes Historical Provider, MD  senna-docusate (SENOKOT-S) 8.6-50 MG tablet Take 1 tablet by mouth 2 (two) times daily.   Yes Historical Provider, MD  traMADol (ULTRAM) 50 MG tablet Take 25-50 mg by mouth 2 (two) times daily as needed for moderate pain.   Yes Historical Provider, MD  traZODone (DESYREL) 50 MG tablet Take 0.5-1 tablets (25-50 mg total) by mouth every 4 (four) hours as needed for sleep  (and/or anxiety). 11/18/15  Yes Sharman CheekPhillip Stafford, MD  vitamin B-12 (CYANOCOBALAMIN) 1000 MCG tablet Take 1,000 mcg by mouth daily.   Yes Historical Provider, MD  risperiDONE (RISPERDAL) 0.5 MG tablet Take 1 tablet (0.5 mg total) by mouth 2 (two) times daily as needed (agitation). Patient not taking: Reported on 10/03/2015 07/03/15   Audery AmelJohn T Clapacs, MD      VITAL SIGNS:  Blood pressure 118/64, pulse 71, temperature 98.1 F (36.7 C), temperature source Oral, resp. rate 21, height 5\' 6"  (1.676 m), weight 58.7 kg (129 lb 6.6 oz), SpO2 96 %.  PHYSICAL EXAMINATION:  Physical Exam  GENERAL:  82 y.o.-year-old patient lying in the bed in no acute distress.  EYES: Pupils equal, round, reactive to light and accommodation. No scleral icterus. Extraocular muscles intact.  HEENT: Head atraumatic, normocephalic. Oropharynx and nasopharynx clear. No oropharyngeal erythema, dry oral mucosa  NECK:  Supple, no jugular venous distention. No thyroid enlargement, no tenderness.  LUNGS: Poor Resp. effort, no wheezing, rales, rhonchi. No use of accessory muscles of respiration.  CARDIOVASCULAR: S1, S2 RRR. No murmurs, rubs, gallops, clicks.  ABDOMEN: Soft, nontender, nondistended. Bowel sounds present. No organomegaly or mass.  EXTREMITIES: No pedal edema, cyanosis, or clubbing. + 2 pedal & radial pulses b/l.   NEUROLOGIC: Cranial nerves II through XII are intact. No focal Motor or sensory deficits appreciated b/l. Globally weak.  PSYCHIATRIC: The patient is alert and oriented x 1. Flat affect SKIN: No obvious rash, lesion, or ulcer.   LABORATORY PANEL:   CBC  Recent Labs Lab 12/26/15 1252  WBC 11.2*  HGB 14.0  HCT 43.4  PLT 251   ------------------------------------------------------------------------------------------------------------------  Chemistries   Recent Labs Lab 12/26/15 1252  NA 140  K 4.0  CL 103  CO2 30  GLUCOSE 137*  BUN 29*  CREATININE 1.07*  CALCIUM 9.1  AST 19  ALT 11*   ALKPHOS 46  BILITOT 0.6   ------------------------------------------------------------------------------------------------------------------  Cardiac Enzymes  Recent Labs Lab 12/26/15 1252  TROPONINI <0.03   ------------------------------------------------------------------------------------------------------------------  RADIOLOGY:  Dg Chest 2 View  12/26/2015  CLINICAL DATA:  Cough, altered mental status EXAM: CHEST  2 VIEW COMPARISON:  11/18/2015 FINDINGS: The heart size and mediastinal contours are within normal limits. Both lungs are clear. The visualized skeletal structures are unremarkable. IMPRESSION: No active cardiopulmonary disease. Electronically Signed   By: Elige KoHetal  Patel   On: 12/26/2015 14:05     IMPRESSION AND PLAN:   82 year old female with past medical history of dementia, anxiety/depression who presented to the hospital due to weakness, altered mental status and noted to have a urinary tract infection.  #1 altered mental status-metabolic encephalopathy secondary to the UTI combined with dehydration. -I will hydrate her with IV fluids, give IV antibiotics for the UTI  and follow mental status.  #2 urinary tract infection-I will give her IV ceftriaxone, follow urine cultures.  #3 generalized weakness-secondary to underlying dementia and also due to underlying UTI/dehydration. -continue treatment as mentioned above -I will get a physical therapy consult to assess mobility. She may need home health services versus short-term rehabilitation.  #4 acute renal failure-secondary dehydration and poor by mouth intake. -Hydrate her with IV fluids, follow BUN/creatinine.  #5 Dementia with behavioral disturbance-continue Depakote, Remeron, Lexapro, Risperdal.  #6 Bradycardia - pt. Was noted to be bradycardia in the ER and will place on off unit tele and monitor.    All the records are reviewed and case discussed with ED provider. Management plans discussed with the  patient, family and they are in agreement.  CODE STATUS: Full  TOTAL TIME TAKING CARE OF THIS PATIENT: 45 minutes.    Houston Siren M.D on 12/26/2015 at 5:07 PM  Between 7am to 6pm - Pager - [phone]  After 6pm go to [email or web address] - password [email or web address] [facility]  [facility]  Office  [phone]  CC: Primary care physician; [facility]

## 2015-12-26 NOTE — ED Notes (Signed)
MD Sinaini at bedside at this time

## 2015-12-26 NOTE — ED Notes (Signed)
Pt had an episode where her HR dipped into the mid 30's, Pt resting, denies complaints, family at bedside, Dr Demetrius CharityP notified.

## 2015-12-26 NOTE — ED Notes (Addendum)
Pt HR dipped down to 35-36 again, MD Sainani made aware, no further orders given at this time. Pt awake and alert, denies associated symptoms. Vitals stable.

## 2015-12-26 NOTE — ED Notes (Signed)
Pt denies pain, states she is just wanting rest.

## 2015-12-26 NOTE — ED Notes (Signed)
Pt arrived via EMS from home with family. EMS reports that patient has been having increased weakness over the past week and altered mental status from baseline. Pt does have a h/o dementia. EMS states family stated she had dark, foul smelling urine as well. Recently having difficulty swallowing and has not been taking her medication due to dysphagia sxs. Oxygen sats on RA were 88% with EMS patient placed on 2L [place]=94-95%. Pt did have a biscuit in her mouth on her way to hospital that resembled thrush. Pt is alert on arrival.

## 2015-12-26 NOTE — ED Notes (Signed)
Pt denies pain at this time. Pt follows simple commands.

## 2015-12-26 NOTE — ED Provider Notes (Addendum)
[facility] Emergency Department Provider Note  Time seen: 1:29 PM  I have reviewed the triage vital signs and the nursing notes.   HISTORY  Chief Complaint Altered Mental Status and Weakness    HPI Ruth Juarez is a 82 y.o. female with a past medical history of Alzheimer's dementia, anxiety, presents to the emergency department with generalized weakness. According to the daughter the patient has been increasingly weak over the past several days. It is gotten to the point where the daughter needs to assist in her ambulation or the patient is unable to walk or get up from a chair by herself which is new. Daughter also states the patient's urine has been smelling very foul over the last few days and she has been coughing. Denies any known fevers. Denies any vomiting. Patient has been more somnolent per daughter as well.Patient has dementia and cannot give an adequate history.     Past Medical History  Diagnosis Date  . Dementia   . Anxiety     Patient Active Problem List   Diagnosis Date Noted  . TIA (transient ischemic attack) 10/03/2015  . Alzheimer's dementia with behavioral disturbance 07/03/2015  . Urinary tract infection 07/03/2015    Past Surgical History  Procedure Laterality Date  . Abdominal hysterectomy    . Tonsillectomy      Current Outpatient Rx  Name  Route  Sig  Dispense  Refill  . acetaminophen (TYLENOL) 325 MG tablet   Oral   Take 325 mg by mouth every 6 (six) hours as needed for mild pain or headache.          Marland Kitchen aspirin 81 MG chewable tablet   Oral   Chew 81 mg by mouth daily.         . calcium carbonate (TUMS - DOSED IN MG ELEMENTAL CALCIUM) 500 MG chewable tablet   Oral   Chew 1 tablet by mouth as needed for indigestion or heartburn.          . clotrimazole (LOTRIMIN) 1 % cream   Topical   Apply 1 application topically every 8 (eight) hours as needed (for irritation on skin).         Marland Kitchen divalproex (DEPAKOTE  ER) 250 MG 24 hr tablet   Oral   Take 250 mg by mouth 2 (two) times daily.         Marland Kitchen escitalopram (LEXAPRO) 10 MG tablet   Oral   Take 10 mg by mouth at bedtime.         Marland Kitchen LORazepam (ATIVAN) 0.5 MG tablet   Oral   Take 1 tablet (0.5 mg total) by mouth every 6 (six) hours as needed for anxiety.   30 tablet   0   . Melatonin 1 MG TABS   Oral   Take 1 mg by mouth at bedtime as needed (for sleep).         . mirtazapine (REMERON) 15 MG tablet   Oral   Take 15 mg by mouth at bedtime.         . Multiple Vitamin (MULTIVITAMIN WITH MINERALS) TABS tablet   Oral   Take 1 tablet by mouth daily.         . risperiDONE (RISPERDAL) 0.5 MG tablet   Oral   Take 1 tablet (0.5 mg total) by mouth 2 (two) times daily as needed (agitation). Patient not taking: Reported on 10/03/2015   60 tablet   0   . risperiDONE (RISPERDAL) 1  MG tablet   Oral   Take 1-2 mg by mouth 2 (two) times daily. Pt takes one tablet in the morning and two tablets at bedtime.         . traMADol (ULTRAM) 50 MG tablet   Oral   Take 25-50 mg by mouth 2 (two) times daily as needed for moderate pain.         . traZODone (DESYREL) 50 MG tablet   Oral   Take 0.5-1 tablets (25-50 mg total) by mouth every 4 (four) hours as needed for sleep (and/or anxiety).   30 tablet   0   . vitamin B-12 (CYANOCOBALAMIN) 1000 MCG tablet   Oral   Take 1,000 mcg by mouth daily.           Allergies Bee venom  Family History  Problem Relation Age of Onset  . Alzheimer's disease Mother   . Alzheimer's disease Sister   . Breast cancer Sister   . Melanoma Sister     Social History Social History  Substance Use Topics  . Smoking status: Never Smoker   . Smokeless tobacco: Not on file  . Alcohol Use: No     Comment: Patient denies     Review of Systems Constitutional: Negative for fever. Cardiovascular: Negative for chest pain. Respiratory: Negative for shortness of breath. Gastrointestinal: Negative for  abdominal pain Neurological: Negative for headache 10-point ROS otherwise negative.  ____________________________________________   PHYSICAL EXAM:  VITAL SIGNS: ED Triage Vitals  Enc Vitals Group     BP 12/26/15 1246 104/64 mmHg     Pulse Rate 12/26/15 1246 82     Resp 12/26/15 1246 12     Temp 12/26/15 1246 98.1 F (36.7 C)     Temp Source 12/26/15 1246 Oral     SpO2 12/26/15 1241 88 %     Weight 12/26/15 1246 129 lb 6.6 oz (58.7 kg)     Height 12/26/15 1246 5\' 6"  (1.676 m)     Head Cir --      Peak Flow --      Pain Score --      Pain Loc --      Pain Edu? --      Excl. in GC? --     Constitutional: Alert. Somnolent but will awaken easily and answer questions, follows commands. Eyes: Normal exam ENT   Head: Normocephalic and atraumatic.   Mouth/Throat: Mucous membranes are moist. Cardiovascular: Normal rate, regular rhythm. No murmur Respiratory: Normal respiratory effort without tachypnea nor retractions. Breath sounds are clear and equal bilaterally. No wheezes/rales/rhonchi. Gastrointestinal: Soft and nontender. No distention. Musculoskeletal: Nontender with normal range of motion in all extremities.  Neurologic:  Normal speech and language. No gross focal neurologic deficits Skin:  Skin is warm, dry and intact.  Psychiatric: Mood and affect are normal. Speech and behavior are normal.  ____________________________________________    RADIOLOGY  X-ray shows no acute abnormality  EKG reviewed and interpreted by myself shows normal sinus rhythm at 83 bpm, narrow QRS, normal axis, normal intervals, no ST changes noted. Overall normal EKG.  ____________________________________________   INITIAL IMPRESSION / ASSESSMENT AND PLAN / ED COURSE  Pertinent labs & imaging results that were available during my care of the patient were reviewed by me and considered in my medical decision making (see chart for details).  Patient with generalized weakness for the  last few days. Daughter states over the past few weeks she has been progressively weaker with a progressive decline  but over the last few days is much worse. Daughter also states foul urine smell, significant cough. We'll obtain labs, chest x-ray, urinalysis to help further evaluate.  Labs show an elevated white blood cell count, along with a urine very consistent with urinary tract infection too numerous to count WBCs. We'll place the patient on IV Rocephin and plan to admit to the hospital for generalized weakness likely due to her urinary tract infection. Daughter is agreeable to plan. Patient's heart rate does occasionally drop into the 40s. Rhythm strip appears to show sinus bradycardia at 40 bpm. It is unclear at this time what is causing the bradycardia. Patient will be admitted to the hospital for IV antibiotics.  ____________________________________________   FINAL CLINICAL IMPRESSION(S) / ED DIAGNOSES  Generalized weakness urinary tract infection    Minna AntisKevin Otelia Hettinger, MD 12/26/15 1444  Minna AntisKevin Lou Irigoyen, MD 12/26/15 1450

## 2015-12-26 NOTE — ED Notes (Signed)
Pt given cup of water at this time, tolerating well. No acute distress noted.

## 2015-12-27 DIAGNOSIS — G9341 Metabolic encephalopathy: Secondary | ICD-10-CM | POA: Diagnosis not present

## 2015-12-27 LAB — BASIC METABOLIC PANEL
ANION GAP: 5 (ref 5–15)
BUN: 31 mg/dL — ABNORMAL HIGH (ref 6–20)
CALCIUM: 8.5 mg/dL — AB (ref 8.9–10.3)
CO2: 30 mmol/L (ref 22–32)
Chloride: 107 mmol/L (ref 101–111)
Creatinine, Ser: 0.73 mg/dL (ref 0.44–1.00)
GFR calc Af Amer: >60 mL/min (ref >60)
GFR calc non Af Amer: >60 mL/min (ref >60)
GLUCOSE: 90 mg/dL (ref 65–99)
Potassium: 3.4 mmol/L — ABNORMAL LOW (ref 3.5–5.1)
Sodium: 142 mmol/L (ref 135–145)

## 2015-12-27 LAB — CBC
HEMATOCRIT: 36.7 % (ref 35.0–47.0)
HEMOGLOBIN: 12 g/dL (ref 12.0–16.0)
MCH: 27.4 pg (ref 26.0–34.0)
MCHC: 32.6 g/dL (ref 32.0–36.0)
MCV: 84.1 fL (ref 80.0–100.0)
Platelets: 235 10*3/uL (ref 150–440)
RBC: 4.36 MIL/uL (ref 3.80–5.20)
RDW: 14.2 % (ref 11.5–14.5)
WBC: 7.9 10*3/uL (ref 3.6–11.0)

## 2015-12-27 MED ORDER — POTASSIUM CHLORIDE 20 MEQ PO PACK
20.0000 meq | PACK | Freq: Once | ORAL | Status: AC
  Administered 2015-12-27: 20 meq via ORAL
  Filled 2015-12-27: qty 1

## 2015-12-27 NOTE — Progress Notes (Signed)
Safety sitter at bedside. Per Dr Allena KatzPatel, ok to d/c sitter. Patient not impulsive at the moment.

## 2015-12-27 NOTE — Progress Notes (Signed)
SNF, Home Health and Non-Emergent EMS Transport Benefits:  Number called: [phone] Rep: Geraldine ContrasDee Reference Number: [id number]  [employer] [employer] coverage active with no deductible, copay, or out of pocket max.     Patient's [employer] policy ID is: [id number]  SNF Benefit: Medicare acts as primary and would cover days 1-20 at 100%.  [employer] would pick up the 20% not covered by Medicare during days 21-100.  If stay is 101+ days, [employer]WA would become primary. A 3 day qualifying hospital stay required.  Auth is required for days 101+.  While auth not required before day 101, plan needs to be notified of patient's admission to a SNF.  Number is [phone] opt 2.    Home Health Benefit: Medicare acts as primary and would cover days 1-100.  If services needs 101+, Berkley Harveyauth would be required: [phone] opt 2.  Non-Emergent EMS Transport Benefit: UMWA covers non-emergent EMS transports as long as they follow Medicare guidelines.   Auth is not required, though EMS would need to send claim to:  [facility] [employer] [address] [address] [place], [place] [phone]

## 2015-12-27 NOTE — Progress Notes (Signed)
[facility] - [place] at [facility]   PATIENT NAME: Ruth Juarez    MR#:  [id number]  DATE OF BIRTH:  22-Oct-1927  SUBJECTIVE:   Pleasantly confused. Sitter in the room ate decent lunch REVIEW OF SYSTEMS:   Review of Systems  Unable to perform ROS: dementia   Tolerating Diet:yes Tolerating PT: rec snf  DRUG ALLERGIES:   Allergies  Allergen Reactions  . Bee Venom Swelling    VITALS:  Blood pressure 128/60, pulse 82, temperature 97.4 F (36.3 C), temperature source Axillary, resp. rate 18, height 5\' 6"  (1.676 m), weight 58.7 kg (129 lb 6.6 oz), SpO2 95 %.  PHYSICAL EXAMINATION:   Physical Exam  GENERAL:  82 y.o.-year-old patient lying in the bed with no acute distress.  EYES: Pupils equal, round, reactive to light and accommodation. No scleral icterus. Extraocular muscles intact.  HEENT: Head atraumatic, normocephalic. Oropharynx and nasopharynx clear.  NECK:  Supple, no jugular venous distention. No thyroid enlargement, no tenderness.  LUNGS: Normal breath sounds bilaterally, no wheezing, rales, rhonchi. No use of accessory muscles of respiration.  CARDIOVASCULAR: S1, S2 normal. No murmurs, rubs, or gallops.  ABDOMEN: Soft, nontender, nondistended. Bowel sounds present. No organomegaly or mass.  EXTREMITIES: No cyanosis, clubbing or edema b/l.    NEUROLOGIC: Cranial nerves II through XII are intact. No focal Motor or sensory deficits b/l.   PSYCHIATRIC: The patient is alert and oriented x 3.  SKIN: No obvious rash, lesion, or ulcer.   LABORATORY PANEL:  CBC  Recent Labs Lab 12/27/15 0313  WBC 7.9  HGB 12.0  HCT 36.7  PLT 235    Chemistries   Recent Labs Lab 12/26/15 1252 12/27/15 0313  NA 140 142  K 4.0 3.4*  CL 103 107  CO2 30 30  GLUCOSE 137* 90  BUN 29* 31*  CREATININE 1.07* 0.73  CALCIUM 9.1 8.5*  AST 19  --   ALT 11*  --   ALKPHOS 46  --   BILITOT 0.6  --     Cardiac Enzymes  Recent Labs Lab 12/26/15 1252   TROPONINI <0.03   RADIOLOGY:  Dg Chest 2 View  12/26/2015  CLINICAL DATA:  Cough, altered mental status EXAM: CHEST  2 VIEW COMPARISON:  11/18/2015 FINDINGS: The heart size and mediastinal contours are within normal limits. Both lungs are clear. The visualized skeletal structures are unremarkable. IMPRESSION: No active cardiopulmonary disease. Electronically Signed   By: Elige Ko   On: 12/26/2015 14:05   ASSESSMENT AND PLAN:  82 year old female with past medical history of dementia, anxiety/depression who presented to the hospital due to weakness, altered mental status and noted to have a urinary tract infection.  #1 altered mental status-metabolic encephalopathy secondary to the UTI combined with dehydration. - will hydrate her with IV fluids, give IV antibiotics for the UTI and follow mental status.  #2 urinary tract infection- will give her IV ceftriaxone, follow urine cultures.  #3 generalized weakness-secondary to underlying dementia and also due to underlying UTI/dehydration. -continue treatment as mentioned above -PT recommends rehab  #4 acute renal failure-secondary dehydration and poor by mouth intake. -Hydrate her with IV fluids, follow BUN/creatinine.  #5 Dementia with behavioral disturbance-continue Depakote, Remeron, Lexapro, Risperdal.  #6 Bradycardia - pt. Was noted to be bradycardia in the ER and will place on off unit tele and monitor.  CSW/CM for d/c planning  Case discussed with Care Management/Social Worker. Management plans discussed with the patient, family and they are in agreement.  CODE STATUS: full  DVT Prophylaxis: lovenox  TOTAL TIME TAKING CARE OF THIS PATIENT: 30 minutes.  >50% time spent on counselling and coordination of care  POSSIBLE D/C IN 1 DAYS, DEPENDING ON CLINICAL CONDITION.  Note: This dictation was prepared with Dragon dictation along with smaller phrase technology. Any transcriptional errors that result from this process are  unintentional.  Ruth Juarez M.D on 12/27/2015 at 4:41 PM  Between 7am to 6pm - Pager - [phone]  After 6pm go to [email or web address] - password [email or web address] [email or web address]  [facility]  Office  [phone]  CC: Primary care physician; [facility]

## 2015-12-27 NOTE — Evaluation (Addendum)
Physical Therapy Evaluation Patient Details Name: Ruth Juarez MRN: [id number] DOB: 05/08/1927 Today's Date: 12/27/2015   History of Present Illness  Patient is an 82 y/o female that presents with UTI, AMS, weakness. Has had intermittent bouts of bradycardia on this admission.   Clinical Impression  Per RN staff patient has been lethargic throughout this admission, noted to have AMS and UTI. Patient has cognitive deficits and is unable to provide history or background in this session. She appears generally weak and deconditioned and requires mod assistance for transfers and bed mobility as a result. During attempt at ambulation she took minimal if any step length for 2-3 "steps" (moreso picking her feet up ~1/2"). Multiple cuing attempts provided, with no ambulation noted. Patient would benefit from short term rehabilitation to increase her independence with mobility.     Follow Up Recommendations SNF    Equipment Recommendations       Recommendations for Other Services       Precautions / Restrictions Precautions Precautions: Fall Restrictions Weight Bearing Restrictions: No      Mobility  Bed Mobility Overal bed mobility: Needs Assistance Bed Mobility: Supine to Sit;Sit to Supine     Supine to sit: Mod assist Sit to supine: Mod assist   General bed mobility comments: Patient is generally lethargic and requires prolonged time and assistance to complete bed mobility secondary to generalized weakness and cognitive status deficits.   Transfers Overall transfer level: Needs assistance Equipment used: Rolling walker (2 wheeled) Transfers: Sit to/from Stand Sit to Stand: Mod assist         General transfer comment: Patient requires cuing for hand placement as well as physical assistance with transfers secondary to weakness.   Ambulation/Gait             General Gait Details: Patient did not perform gait though she appeared capable of doing so secondary to  cognitive deficits. Cued multiple times by tugging on walker, verbally, providing nudge, patient did not take steps with any of the above.   Stairs            Wheelchair Mobility    Modified Rankin (Stroke Patients Only)       Balance Overall balance assessment: Needs assistance Sitting-balance support: Bilateral upper extremity supported Sitting balance-Leahy Scale: Fair       Standing balance-Leahy Scale: Poor Standing balance comment: Patient demonstrates poor use of RW, posterior lean in standing, and very short steps when attempted to complete.                              Pertinent Vitals/Pain Pain Assessment:  (Patient does not seem to indicate pain in this session via facial expressions or activity avoidance)    Home Living Family/patient expects to be discharged to:: Private residence Living Arrangements: Children               Additional Comments: Patient has dementia and is unable to provide ROS/history    Prior Function                 Hand Dominance        Extremity/Trunk Assessment   Upper Extremity Assessment: Generalized weakness           Lower Extremity Assessment: Generalized weakness         Communication   Communication: HOH  Cognition Arousal/Alertness: Lethargic Behavior During Therapy: Flat affect Overall Cognitive Status: Difficult to assess  General Comments      Exercises        Assessment/Plan    PT Assessment Patient needs continued PT services  PT Diagnosis Difficulty walking;Generalized weakness;Altered mental status   PT Problem List Decreased strength;Decreased mobility;Decreased safety awareness;Decreased knowledge of use of DME;Decreased balance;Decreased activity tolerance  PT Treatment Interventions Therapeutic activities;DME instruction;Gait training;Therapeutic exercise;Balance training;Stair training   PT Goals (Current goals can be found in the  Care Plan section) Acute Rehab PT Goals PT Goal Formulation: Patient unable to participate in goal setting Time For Goal Achievement: 01/10/16 Potential to Achieve Goals: Fair    Frequency Min 2X/week   Barriers to discharge        Co-evaluation               End of Session Equipment Utilized During Treatment: Gait belt Activity Tolerance: Patient limited by lethargy Patient left: in bed;with bed alarm set;with call bell/phone within reach;with nursing/sitter in room Nurse Communication: Mobility status         Time: [id number] PT Time Calculation (min) (ACUTE ONLY): 17 min   Charges:   PT Evaluation $PT Eval Low Complexity: 1 Procedure       PT G Codes:    Functional Limitation Tool Used (Clinical judgement)  Functional Limitation: Walking and Moving Around  Walking and Moving Around Current Status: CL 60-80%  Walking and Moving Around Goal Status: CK 40-60%   Kerin RansomPatrick A McNamara, PT, DPT    12/27/2015, 4:35 PM  Addendum: LATE ENTRY G-Codes  Kerin RansomPatrick A McNamara, PT, DPT

## 2015-12-27 NOTE — Care Management Note (Signed)
Case Management Note  Patient Details  Name: Ruth Juarez MRN: [id number] Date of Birth: 09/04/1927  Subjective/Objective:                  Attempted to assess patient for discharge planning. Patient was sleeping and sitter was turning patient in bed to relieve pressure- patient was still asleep. I contacted her daughter Ruth Juarez by phone [phone]. Diane states that patient has [place] staying with [place]. She states that [place] had basically condemned patient's home due to animal waste and patient's resistance to improve. She states that up until two weeks ago patient was going shopping and out to eat with Diane but "she has become so weak she cannot walk now". She ws able to walk with a walker- she now shuffles with limitation". Diane states that she had trouble getting patient to go to the hospital due to altered mental status- agitated per Diane. She gets her Rx from [facility]. [facility]She has history of using [facility]. There had not been discussion of hospice care in the past. Diane is "exhausted and hurt her back lifting patient".   Action/Plan: List of private duty care left at patient's bedside along with home health care agencies. PT pending. RNCM will continue to follow.  Expected Discharge Date:                  Expected Discharge Plan:     In-House Referral:     Discharge planning Services  CM Consult  Post Acute Care Choice:  Home Health Choice offered to:  Adult Children  DME Arranged:    DME Agency:     HH Arranged:    HH Agency:     Status of Service:  In process, will continue to follow  Medicare Important Message Given:    Date Medicare IM Given:    Medicare IM give by:    Date Additional Medicare IM Given:    Additional Medicare Important Message give by:     If discussed at Long Length of Stay Meetings, dates discussed:    Additional Comments:  Collie Siadngela Avnoor Koury, RN 12/27/2015,  10:44 AM

## 2015-12-27 NOTE — Care Management Obs Status (Signed)
MEDICARE OBSERVATION STATUS NOTIFICATION   Patient Details  Name: Ruth Juarez MRN: [id number] Date of Birth: 02/26/1927   Medicare Observation Status Notification Given:  Yes    Collie Siadngela Emika Tiano, RN 12/27/2015, 2:54 PM

## 2015-12-28 DIAGNOSIS — G9341 Metabolic encephalopathy: Secondary | ICD-10-CM | POA: Diagnosis not present

## 2015-12-28 MED ORDER — CEPHALEXIN 500 MG PO CAPS: 500.0000 mg | ORAL_CAPSULE | Freq: Two times a day (BID) | ORAL | Status: DC

## 2015-12-28 MED ORDER — POLYETHYLENE GLYCOL 3350 17 G PO PACK
17.0000 g | PACK | Freq: Once | ORAL | Status: AC
Start: 2015-12-28 — End: 2015-12-28
  Administered 2015-12-28: 17 g via ORAL
  Filled 2015-12-28: qty 1

## 2015-12-28 MED ORDER — CEPHALEXIN 500 MG PO CAPS
500.0000 mg | ORAL_CAPSULE | Freq: Two times a day (BID) | ORAL | Status: DC
  Administered 2015-12-28: 500 mg via ORAL
  Filled 2015-12-28: qty 1

## 2015-12-28 NOTE — Progress Notes (Signed)
Patient discharged home with daughter. Instructions given to daughter, verbalized understanding. Daughter providing transportation home.

## 2015-12-28 NOTE — Care Management (Signed)
Spoke with patient's daughter Sed[place] MutaDiane and she would like to use [facility]. HHRN, HHPT, HH CNA, HH CSW. [facility]. Diane said she can ride in the car with her to home.  No further RNCM needs. Case closed.

## 2015-12-28 NOTE — Discharge Summary (Signed)
[facility] [place] at [facility]   PATIENT NAME: Ruth Juarez    MR#:  [id number]  DATE OF BIRTH:  09-07-1927  DATE OF ADMISSION:  12/26/2015 ADMITTING PHYSICIAN: Houston Siren, MD  DATE OF DISCHARGE: 12/28/15  PRIMARY CARE PHYSICIAN: [facility]    ADMISSION DIAGNOSIS:  UTI (lower urinary tract infection) [N39.0] Generalized weakness [R53.1]  DISCHARGE DIAGNOSIS:  Generalized Weakness UTI Dementia  SECONDARY DIAGNOSIS:   Past Medical History  Diagnosis Date  . Dementia   . Anxiety     HOSPITAL COURSE:   82 year old female with past medical history of dementia, anxiety/depression who presented to the hospital due to weakness, altered mental status and noted to have a urinary tract infection.  #1 altered mental status-metabolic encephalopathy secondary to the UTI combined with dehydration. - reiceved IV fluids -po keflex.  #2 urinary tract infection- will give her IV ceftriaxone -changed to po abxs  #3 generalized weakness-secondary to underlying dementia and also due to underlying UTI/dehydration. -continue treatment as mentioned above -PT recommends rehab -CM to work on d/c planning  #4 acute renal failure-secondary dehydration and poor by mouth intake. -appears euvolemic -po intake improving  #5 Dementia with behavioral disturbance-continue Depakote, Remeron, Lexapro, Risperdal.  #6 Bradycardia - pt. Was noted to be bradycardia in the ER  -resolved. Back in SR  Will d/c her once CM able to d/w dter d/c plans Spoke with Diane white y'day  CONSULTS OBTAINED:    none DRUG ALLERGIES:   Allergies  Allergen Reactions  . Bee Venom Swelling    DISCHARGE MEDICATIONS:   Current Discharge Medication List    START taking these medications   Details  cephALEXin (KEFLEX) 500 MG capsule Take 1 capsule (500 mg total) by mouth every 12 (twelve) hours. Qty: 10 capsule, Refills: 0      CONTINUE these medications  which have NOT CHANGED   Details  acetaminophen (TYLENOL) 325 MG tablet Take 325 mg by mouth every 6 (six) hours as needed for mild pain or headache.     aspirin 81 MG chewable tablet Chew 81 mg by mouth daily.    calcium carbonate (TUMS - DOSED IN MG ELEMENTAL CALCIUM) 500 MG chewable tablet Chew 1 tablet by mouth as needed for indigestion or heartburn.     clotrimazole (LOTRIMIN) 1 % cream Apply 1 application topically every 8 (eight) hours as needed (for skin irritation).     divalproex (DEPAKOTE ER) 250 MG 24 hr tablet Take 250 mg by mouth 2 (two) times daily.    escitalopram (LEXAPRO) 10 MG tablet Take 10 mg by mouth at bedtime.    LORazepam (ATIVAN) 0.5 MG tablet Take 1 tablet (0.5 mg total) by mouth every 6 (six) hours as needed for anxiety. Qty: 30 tablet, Refills: 0    Melatonin 1 MG TABS Take 1 mg by mouth at bedtime as needed (for sleep).    mirtazapine (REMERON) 15 MG tablet Take 15 mg by mouth at bedtime.    Multiple Vitamin (MULTIVITAMIN WITH MINERALS) TABS tablet Take 1 tablet by mouth daily.    !! risperiDONE (RISPERDAL) 1 MG tablet Take 1-2 mg by mouth 2 (two) times daily. Pt takes one tablet in the morning and two tablets at bedtime.    senna-docusate (SENOKOT-S) 8.6-50 MG tablet Take 1 tablet by mouth 2 (two) times daily.    traMADol (ULTRAM) 50 MG tablet Take 25-50 mg by mouth 2 (two) times daily as needed for moderate pain.  traZODone (DESYREL) 50 MG tablet Take 0.5-1 tablets (25-50 mg total) by mouth every 4 (four) hours as needed for sleep (and/or anxiety). Qty: 30 tablet, Refills: 0    vitamin B-12 (CYANOCOBALAMIN) 1000 MCG tablet Take 1,000 mcg by mouth daily.    !! risperiDONE (RISPERDAL) 0.5 MG tablet Take 1 tablet (0.5 mg total) by mouth 2 (two) times daily as needed (agitation). Qty: 60 tablet, Refills: 0     !! - Potential duplicate medications found. Please discuss with provider.      If you experience worsening of your admission symptoms,  develop shortness of breath, life threatening emergency, suicidal or homicidal thoughts you must seek medical attention immediately by calling 911 or calling your MD immediately  if symptoms less severe.  You Must read complete instructions/literature along with all the possible adverse reactions/side effects for all the Medicines you take and that have been prescribed to you. Take any new Medicines after you have completely understood and accept all the possible adverse reactions/side effects.   Please note  You were cared for by a hospitalist during your hospital stay. If you have any questions about your discharge medications or the care you received while you were in the hospital after you are discharged, you can call the unit and asked to speak with the hospitalist on call if the hospitalist that took care of you is not available. Once you are discharged, your primary care physician will handle any further medical issues. Please note that NO REFILLS for any discharge medications will be authorized once you are discharged, as it is imperative that you return to your primary care physician (or establish a relationship with a primary care physician if you do not have one) for your aftercare needs so that they can reassess your need for medications and monitor your lab values. Today   SUBJECTIVE   No new issues per RN dementia  VITAL SIGNS:  Blood pressure 139/60, pulse 63, temperature 98 F (36.7 C), temperature source Oral, resp. rate 18, height 5\' 6"  (1.676 m), weight 58.7 kg (129 lb 6.6 oz), SpO2 96 %.  I/O:   Intake/Output Summary (Last 24 hours) at 12/28/15 0700 Last data filed at 12/28/15 0041  Gross per 24 hour  Intake    480 ml  Output    700 ml  Net   -220 ml    PHYSICAL EXAMINATION:  GENERAL:  82 y.o.-year-old patient lying in the bed with no acute distress.  EYES: Pupils equal, round, reactive to light and accommodation. No scleral icterus. Extraocular muscles intact.   HEENT: Head atraumatic, normocephalic. Oropharynx and nasopharynx clear.  NECK:  Supple, no jugular venous distention. No thyroid enlargement, no tenderness.  LUNGS: Normal breath sounds bilaterally, no wheezing, rales,rhonchi or crepitation. No use of accessory muscles of respiration.  CARDIOVASCULAR: S1, S2 normal. No murmurs, rubs, or gallops.  ABDOMEN: Soft, non-tender, non-distended. Bowel sounds present. No organomegaly or mass.  EXTREMITIES: No pedal edema, cyanosis, or clubbing.  NEUROLOGIC: non focal unable to assess due to dementia PSYCHIATRIC:patient is alert SKIN: No obvious rash, lesion, or ulcer.   DATA REVIEW:   CBC   Recent Labs Lab 12/27/15 0313  WBC 7.9  HGB 12.0  HCT 36.7  PLT 235    Chemistries   Recent Labs Lab 12/26/15 1252 12/27/15 0313  NA 140 142  K 4.0 3.4*  CL 103 107  CO2 30 30  GLUCOSE 137* 90  BUN 29* 31*  CREATININE 1.07* 0.73  CALCIUM 9.1  8.5*  AST 19  --   ALT 11*  --   ALKPHOS 46  --   BILITOT 0.6  --     Microbiology Results   Recent Results (from the past 240 hour(s))  Urine culture     Status: None (Preliminary result)   Collection Time: 12/26/15  1:04 PM  Result Value Ref Range Status   Specimen Description URINE, CLEAN CATCH  Final   Special Requests NONE  Final   Culture HOLDING FOR POSSIBLE PATHOGEN  Final   Report Status PENDING  Incomplete    RADIOLOGY:  Dg Chest 2 View  12/26/2015  CLINICAL DATA:  Cough, altered mental status EXAM: CHEST  2 VIEW COMPARISON:  11/18/2015 FINDINGS: The heart size and mediastinal contours are within normal limits. Both lungs are clear. The visualized skeletal structures are unremarkable. IMPRESSION: No active cardiopulmonary disease. Electronically Signed   By: Elige Ko   On: 12/26/2015 14:05     Management plans discussed with the patient, family and they are in agreement.  CODE STATUS:     Code Status Orders        Start     Ordered   12/26/15 1746  Full code    Continuous     12/26/15 1746    Code Status History    Date Active Date Inactive Code Status Order ID Comments User Context   10/03/2015  8:29 PM 10/04/2015  2:07 PM Full Code [id number]  Peterson Lombard, RN Inpatient   10/03/2015  7:22 PM 10/03/2015  8:29 PM Full Code [id number]  Gale Journey, MD Inpatient    Advance Directive Documentation        Most Recent Value   Type of Advance Directive  Living will, Healthcare Power of Attorney   Pre-existing out of facility DNR order (yellow form or pink MOST form)     "MOST" Form in Place?        TOTAL TIME TAKING CARE OF THIS PATIENT: 40 minutes.    Monserath Neff M.D on 12/28/2015 at 7:00 AM  Between 7am to 6pm - Pager - [phone] After 6pm go to [email or web address] - password [email or web address]  [facility] [place] [facility]  Office  [phone]  CC: Primary care physician; [facility]

## 2015-12-29 LAB — URINE CULTURE: Culture: >=100000

## 2016-04-28 ENCOUNTER — Emergency Department: Payer: Medicare Other

## 2016-04-28 ENCOUNTER — Encounter: Payer: Self-pay | Admitting: Emergency Medicine

## 2016-04-28 ENCOUNTER — Inpatient Hospital Stay
Admission: EM | Admit: 2016-04-28 | Discharge: 2016-05-02 | DRG: 177 | Disposition: A | Discharge status: Hospice | Payer: Medicare Other | Attending: Internal Medicine | Admitting: Internal Medicine

## 2016-04-28 DIAGNOSIS — J9601 Acute respiratory failure with hypoxia: Secondary | ICD-10-CM | POA: Diagnosis present

## 2016-04-28 DIAGNOSIS — J189 Pneumonia, unspecified organism: Secondary | ICD-10-CM | POA: Diagnosis present

## 2016-04-28 DIAGNOSIS — Z515 Encounter for palliative care: Secondary | ICD-10-CM | POA: Diagnosis present

## 2016-04-28 DIAGNOSIS — Z820 Family history of epilepsy and other diseases of the nervous system: Secondary | ICD-10-CM | POA: Diagnosis not present

## 2016-04-28 DIAGNOSIS — F0390 Unspecified dementia without behavioral disturbance: Secondary | ICD-10-CM | POA: Diagnosis present

## 2016-04-28 DIAGNOSIS — Z803 Family history of malignant neoplasm of breast: Secondary | ICD-10-CM | POA: Diagnosis not present

## 2016-04-28 DIAGNOSIS — R4182 Altered mental status, unspecified: Secondary | ICD-10-CM | POA: Diagnosis present

## 2016-04-28 DIAGNOSIS — Z808 Family history of malignant neoplasm of other organs or systems: Secondary | ICD-10-CM

## 2016-04-28 DIAGNOSIS — F419 Anxiety disorder, unspecified: Secondary | ICD-10-CM | POA: Diagnosis present

## 2016-04-28 DIAGNOSIS — Z792 Long term (current) use of antibiotics: Secondary | ICD-10-CM

## 2016-04-28 DIAGNOSIS — J690 Pneumonitis due to inhalation of food and vomit: Secondary | ICD-10-CM | POA: Diagnosis present

## 2016-04-28 DIAGNOSIS — Z66 Do not resuscitate: Secondary | ICD-10-CM | POA: Diagnosis present

## 2016-04-28 DIAGNOSIS — F0280 Dementia in other diseases classified elsewhere without behavioral disturbance: Secondary | ICD-10-CM | POA: Diagnosis present

## 2016-04-28 DIAGNOSIS — G309 Alzheimer's disease, unspecified: Secondary | ICD-10-CM | POA: Diagnosis present

## 2016-04-28 DIAGNOSIS — Z91030 Bee allergy status: Secondary | ICD-10-CM | POA: Diagnosis not present

## 2016-04-28 DIAGNOSIS — Z7982 Long term (current) use of aspirin: Secondary | ICD-10-CM

## 2016-04-28 DIAGNOSIS — Z79899 Other long term (current) drug therapy: Secondary | ICD-10-CM | POA: Diagnosis not present

## 2016-04-28 LAB — URINALYSIS COMPLETE WITH MICROSCOPIC (ARMC ONLY)
BILIRUBIN URINE: NEGATIVE
GLUCOSE, UA: NEGATIVE mg/dL
HGB URINE DIPSTICK: NEGATIVE
Nitrite: NEGATIVE
PH: 7 (ref 5.0–8.0)
Protein, ur: NEGATIVE mg/dL
RBC / HPF: NONE SEEN RBC/hpf (ref 0–5)
Specific Gravity, Urine: 1.013 (ref 1.005–1.030)
Squamous Epithelial / LPF: NONE SEEN

## 2016-04-28 LAB — CBC WITH DIFFERENTIAL/PLATELET
BASOS PCT: 0 %
Basophils Absolute: 0 10*3/uL (ref 0–0.1)
EOS PCT: 0 %
Eosinophils Absolute: 0 10*3/uL (ref 0–0.7)
HCT: 45.8 % (ref 35.0–47.0)
HEMOGLOBIN: 14.8 g/dL (ref 12.0–16.0)
Lymphocytes Relative: 4 %
Lymphs Abs: 0.3 10*3/uL — ABNORMAL LOW (ref 1.0–3.6)
MCH: 27.3 pg (ref 26.0–34.0)
MCHC: 32.3 g/dL (ref 32.0–36.0)
MCV: 84.5 fL (ref 80.0–100.0)
MONOS PCT: 5 %
Monocytes Absolute: 0.4 10*3/uL (ref 0.2–0.9)
NEUTROS PCT: 91 %
Neutro Abs: 7.3 10*3/uL — ABNORMAL HIGH (ref 1.4–6.5)
Platelets: 195 10*3/uL (ref 150–440)
RBC: 5.42 MIL/uL — AB (ref 3.80–5.20)
RDW: 15.1 % — ABNORMAL HIGH (ref 11.5–14.5)
WBC: 8 10*3/uL (ref 3.6–11.0)

## 2016-04-28 LAB — MRSA PCR SCREENING: MRSA by PCR: NEGATIVE

## 2016-04-28 LAB — COMPREHENSIVE METABOLIC PANEL
ALBUMIN: 3.7 g/dL (ref 3.5–5.0)
ALT: 9 U/L — AB (ref 14–54)
ANION GAP: 9 (ref 5–15)
AST: 21 U/L (ref 15–41)
Alkaline Phosphatase: 46 U/L (ref 38–126)
BUN: 27 mg/dL — ABNORMAL HIGH (ref 6–20)
CHLORIDE: 102 mmol/L (ref 101–111)
CO2: 30 mmol/L (ref 22–32)
CREATININE: 0.78 mg/dL (ref 0.44–1.00)
Calcium: 9.2 mg/dL (ref 8.9–10.3)
GFR calc non Af Amer: >60 mL/min (ref >60)
GLUCOSE: 160 mg/dL — AB (ref 65–99)
Potassium: 4 mmol/L (ref 3.5–5.1)
SODIUM: 141 mmol/L (ref 135–145)
Total Bilirubin: 0.5 mg/dL (ref 0.3–1.2)
Total Protein: 7.7 g/dL (ref 6.5–8.1)

## 2016-04-28 LAB — TROPONIN I: Troponin I: <0.03 ng/mL (ref <0.031)

## 2016-04-28 LAB — LACTIC ACID, PLASMA
LACTIC ACID, VENOUS: 2.3 mmol/L — AB (ref 0.5–2.0)
Lactic Acid, Venous: 2 mmol/L (ref 0.5–2.0)

## 2016-04-28 MED ORDER — SODIUM CHLORIDE 0.9 % IV SOLN
INTRAVENOUS | Status: DC
Start: 2016-04-28 — End: 2016-05-02
  Administered 2016-04-28 – 2016-05-02 (×4): via INTRAVENOUS

## 2016-04-28 MED ORDER — HEPARIN SODIUM (PORCINE) 5000 UNIT/ML IJ SOLN
5000.0000 [IU] | Freq: Three times a day (TID) | INTRAMUSCULAR | Status: DC
  Administered 2016-04-28 – 2016-04-30 (×3): 5000 [IU] via SUBCUTANEOUS
  Filled 2016-04-28 (×3): qty 1

## 2016-04-28 MED ORDER — RISPERIDONE 1 MG PO TABS
1.0000 mg | ORAL_TABLET | Freq: Every day | ORAL | Status: DC
  Administered 2016-05-01: 1 mg via ORAL
  Filled 2016-04-28: qty 1

## 2016-04-28 MED ORDER — PIPERACILLIN-TAZOBACTAM 3.375 G IVPB 30 MIN
3.3750 g | Freq: Once | INTRAVENOUS | Status: AC
  Administered 2016-04-28: 3.375 g via INTRAVENOUS
  Filled 2016-04-28: qty 50

## 2016-04-28 MED ORDER — DIVALPROEX SODIUM 250 MG PO DR TAB
250.0000 mg | DELAYED_RELEASE_TABLET | Freq: Two times a day (BID) | ORAL | Status: DC
  Administered 2016-05-01: 250 mg via ORAL
  Filled 2016-04-28 (×2): qty 1

## 2016-04-28 MED ORDER — SODIUM CHLORIDE 0.9 % IV BOLUS (SEPSIS)
1000.0000 mL | Freq: Once | INTRAVENOUS | Status: AC
  Administered 2016-04-28: 1000 mL via INTRAVENOUS

## 2016-04-28 MED ORDER — VANCOMYCIN HCL IN DEXTROSE 1-5 GM/200ML-% IV SOLN
1000.0000 mg | Freq: Once | INTRAVENOUS | Status: AC
  Administered 2016-04-28: 1000 mg via INTRAVENOUS
  Filled 2016-04-28: qty 200

## 2016-04-28 MED ORDER — SODIUM CHLORIDE 0.9 % IV SOLN
1.5000 g | Freq: Three times a day (TID) | INTRAVENOUS | Status: DC
  Administered 2016-04-28 – 2016-05-02 (×11): 1.5 g via INTRAVENOUS
  Filled 2016-04-28 (×13): qty 1.5

## 2016-04-28 NOTE — ED Notes (Signed)
Report given to Susan, RN

## 2016-04-28 NOTE — ED Notes (Signed)
Pt presents from home via [facility]. Per EMS pt has been on a "slow decline" and is now unable to stand. Per EMS pt's daughter (whom she lives with) is no long able to change patient or get her up or to the doctors appt. Pt presents with labored breathing, has a baseline hx of dementia. Pt noted to have a wet sounding cough, with labored respirations. Pt intermittently able to answer questions but is for the most part non-verbal.

## 2016-04-28 NOTE — Progress Notes (Signed)
Nonrebreather. Sinus arrthymia. NPO due to being lethargic. Foley. Only reponds to stimuli. Daughter updated on plan of care. Pt has no further concerns at this time.

## 2016-04-28 NOTE — H&P (Signed)
[facility] [place] at [facility]   PATIENT NAME: Ruth Juarez    MR#:  [id number]  DATE OF BIRTH:  07/17/1927  DATE OF ADMISSION:  04/28/2016  [facility]   REQUESTING/REFERRING PHYSICIAN: Derrill KayGoodman  CHIEF COMPLAINT:   Chief Complaint  Patient presents with  . Altered Mental Status    HISTORY OF PRESENT ILLNESS: Ruth Juarez  is a 82 y.o. female with a known history of Dementia and anxiety, was living in [place] but 2 years ago when she was very sick and Department [employer] Social Services took over and finally her daughter who lives here and was retired went over there and took custody of her and brought her to live with her. Daughter is taking care of her at her home and as per her patient clearly told her she does not want to ever leaving the nursing home. Since December daughter noted patient's condition has been gradually going down up to the level that she cannot go out of the house, and need support with feeding and being confused and irritable frequently. For last 1-2 weeks patient had some cough which really got worse for last 4 days and up to the level that she was staying mostly sleepy was not waking up much to eat also and daughter decided to bring her to emergency room today. Patient's daughter who was my main source of all the history was also present in the room and she told me since last 4 days she herself is also having some cough and is taking amoxicillin for that.  In ER patient was noted to have pneumonia and as per daughter she had multiple choking episodes for last few weeks and she had been giving her crust food and crushed medications. Patient's oxygen level was noted to be 82 on room air and she was started on the Ventimask oxygen with stable vitals so given to hospitalist team for further management.  PAST MEDICAL HISTORY:   Past Medical History  Diagnosis Date  . Dementia   . Anxiety     PAST  SURGICAL HISTORY: Past Surgical History  Procedure Laterality Date  . Abdominal hysterectomy    . Tonsillectomy      SOCIAL HISTORY:  Social History  Substance Use Topics  . Smoking status: Never Smoker   . Smokeless tobacco: Not on file  . Alcohol Use: No     Comment: Patient denies     FAMILY HISTORY:  Family History  Problem Relation Age of Onset  . Alzheimer's disease Mother   . Alzheimer's disease Sister   . Breast cancer Sister   . Melanoma Sister     DRUG ALLERGIES:  Allergies  Allergen Reactions  . Bee Venom Swelling    REVIEW OF SYSTEMS:   Because of patient's altered mental status and drowsiness she is not able to give me any history.  MEDICATIONS AT HOME:  Prior to Admission medications   Medication Sig Start Date End Date Taking? Authorizing Provider  acetaminophen (TYLENOL) 325 MG tablet Take 325 mg by mouth every 6 (six) hours as needed for mild pain or headache.    Yes Historical Provider, MD  aspirin EC 81 MG tablet Take 81 mg by mouth daily.   Yes Historical Provider, MD  divalproex (DEPAKOTE) 250 MG DR tablet Take 250 mg by mouth 2 (two) times daily.   Yes Historical Provider, MD  escitalopram (LEXAPRO) 10 MG tablet Take 10 mg by mouth at bedtime.  Yes Historical Provider, MD  LORazepam (ATIVAN) 0.5 MG tablet Take 1 tablet (0.5 mg total) by mouth every 6 (six) hours as needed for anxiety. 11/18/15 11/17/16 Yes Sharman Cheek, MD  mirtazapine (REMERON) 15 MG tablet Take 15 mg by mouth at bedtime.   Yes Historical Provider, MD  risperiDONE (RISPERDAL) 1 MG tablet Take 1 mg by mouth daily.    Yes Historical Provider, MD  senna-docusate (SENOKOT-S) 8.6-50 MG tablet Take 2 tablets by mouth daily.    Yes Historical Provider, MD  traMADol (ULTRAM) 50 MG tablet Take 50 mg by mouth 2 (two) times daily as needed for moderate pain.    Yes Historical Provider, MD  traZODone (DESYREL) 50 MG tablet Take 0.5-1 tablets (25-50 mg total) by mouth every 4 (four) hours as  needed for sleep (and/or anxiety). Patient taking differently: Take 25 mg by mouth every 4 (four) hours as needed for sleep (and/or anxiety).  11/18/15  Yes Sharman Cheek, MD  aspirin 81 MG chewable tablet Chew 81 mg by mouth daily.    Historical Provider, MD  cephALEXin (KEFLEX) 500 MG capsule Take 1 capsule (500 mg total) by mouth every 12 (twelve) hours. 12/28/15   Enedina Finner, MD  risperiDONE (RISPERDAL) 0.5 MG tablet Take 1 tablet (0.5 mg total) by mouth 2 (two) times daily as needed (agitation). Patient not taking: Reported on 10/03/2015 07/03/15   Audery Amel, MD      PHYSICAL EXAMINATION:   VITAL SIGNS: Blood pressure 111/83, pulse 49, temperature 99.2 F (37.3 C), temperature source Rectal, resp. rate 28, height 5\' 4"  (1.626 m), weight 53.343 kg (117 lb 9.6 oz), SpO2 100 %.  GENERAL:  82 y.o.-year-old patient lying in the bed with no acute distress.  EYES: Pupils equal, round, reactive to light and accommodation. No scleral icterus.  HEENT: Head atraumatic, normocephalic. Oropharynx and nasopharynx clear. Mucosa dry NECK:  Supple, no jugular venous distention. No thyroid enlargement, no tenderness.  LUNGS: Normal breath sounds bilaterally, no wheezing, some crepitation. No use of accessory muscles of respiration. Using oxygen via mask. CARDIOVASCULAR: S1, S2 normal. No murmurs, rubs, or gallops.  ABDOMEN: Soft, nontender, nondistended. Bowel sounds present. No organomegaly or mass.  EXTREMITIES: No pedal edema, cyanosis, or clubbing.  NEUROLOGIC: Patient is drowsy and staying mostly sleepy, she just more on to the stimuli but does not follow commands.  PSYCHIATRIC: The patient is drowsy  SKIN: No obvious rash, lesion, or ulcer.   LABORATORY PANEL:   CBC  Recent Labs Lab 04/28/16 1035  WBC 8.0  HGB 14.8  HCT 45.8  PLT 195  MCV 84.5  MCH 27.3  MCHC 32.3  RDW 15.1*  LYMPHSABS 0.3*  MONOABS 0.4  EOSABS 0.0  BASOSABS 0.0    ------------------------------------------------------------------------------------------------------------------  Chemistries   Recent Labs Lab 04/28/16 1035  NA 141  K 4.0  CL 102  CO2 30  GLUCOSE 160*  BUN 27*  CREATININE 0.78  CALCIUM 9.2  AST 21  ALT 9*  ALKPHOS 46  BILITOT 0.5   ------------------------------------------------------------------------------------------------------------------ estimated creatinine clearance is 42.5 mL/min (by C-G formula based on Cr of 0.78). ------------------------------------------------------------------------------------------------------------------ No results for input(s): TSH, T4TOTAL, T3FREE, THYROIDAB in the last 72 hours.  Invalid input(s): FREET3   Coagulation profile No results for input(s): INR, PROTIME in the last 168 hours. ------------------------------------------------------------------------------------------------------------------- No results for input(s): DDIMER in the last 72 hours. -------------------------------------------------------------------------------------------------------------------  Cardiac Enzymes  Recent Labs Lab 04/28/16 1035  TROPONINI <0.03   ------------------------------------------------------------------------------------------------------------------ Invalid input(s): POCBNP  ---------------------------------------------------------------------------------------------------------------  Urinalysis    Component Value Date/Time   COLORURINE YELLOW* 04/28/2016 1035   APPEARANCEUR HAZY* 04/28/2016 1035   LABSPEC 1.013 04/28/2016 1035   PHURINE 7.0 04/28/2016 1035   GLUCOSEU NEGATIVE 04/28/2016 1035   HGBUR NEGATIVE 04/28/2016 1035   BILIRUBINUR NEGATIVE 04/28/2016 1035   KETONESUR 1+* 04/28/2016 1035   PROTEINUR NEGATIVE 04/28/2016 1035   NITRITE NEGATIVE 04/28/2016 1035   LEUKOCYTESUR TRACE* 04/28/2016 1035     RADIOLOGY: Dg Chest Port 1 View  04/28/2016  CLINICAL  DATA:  Labored breathing, dementia, cough EXAM: PORTABLE CHEST 1 VIEW COMPARISON:  12/26/2015 FINDINGS: Patchy left lower lobe retrocardiac opacity, concerning for left lower lobe pneumonia. Hyperinflation noted. Right lung remains clear. No edema, effusion or pneumothorax. Mild cardiac enlargement. Atherosclerosis noted of the aorta. IMPRESSION: Left lower lobe airspace process concerning for pneumonia. Hyperinflation. Electronically Signed   By: Judie Petit.  Shick M.D.   On: 04/28/2016 11:16    EKG: Orders placed or performed during the hospital encounter of 12/26/15  . EKG 12-Lead  . EKG 12-Lead  . EKG    IMPRESSION AND PLAN:  * Acute hypoxic respiratory failure with altered mental status secondary to aspiration pneumonia.   Patient appears reaching at the end of her life, I had detailed in long discussion with her daughter, she understands that and she does not want her mother to suffer anymore, and she would never be willing her to go to a nursing home. Currently she would like to give a chance with a day or 2 of antibiotics and if patient does not improve she would be willing to take her home with hospice services.  I will give Unasyn IV and check MRSA screening if I need to add vancomycin or not, she received 1 dose of vancomycin by ER.  If she recovers then we will need to have swallow evaluation done before discharge.  I will call palliative care and hospice consult to help planning to discharge  * Dementia  Continue home meds.  * WBCs in urine   As per daughter patient frequently runs UTI , she was supposed to go and see a urologist in the past but could not see.   She received 1 dose of Zosyn in the emergency room, urine culture is sent.    All the records are reviewed and case discussed with ED provider. Management plans discussed with the patient, family and they are in agreement.  CODE STATUS: DNR Code Status History    Date Active Date Inactive Code Status Order ID Comments User  Context   12/26/2015  5:46 PM 12/28/2015  3:06 PM Full Code [id number]  Houston Siren, MD Inpatient   10/03/2015  8:29 PM 10/04/2015  2:07 PM Full Code [id number]  Peterson Lombard, RN Inpatient   10/03/2015  7:22 PM 10/03/2015  8:29 PM Full Code [id number]  Gale Journey, MD Inpatient     Discussed with patient's daughter and power of attorney was present in the room.  TOTAL TIME TAKING CARE OF THIS PATIENT: 50 mins.    Altamese Dilling M.D on 04/28/2016   Between 7am to 6pm - Pager - [phone]  After 6pm go to [email or web address] - password [email or web address]  [facility]  Office  [phone]  CC: Primary care physician; [facility]   Note: This dictation was prepared with Dragon dictation along with smaller phrase technology. Any transcriptional errors that result from this process are unintentional.

## 2016-04-28 NOTE — ED Notes (Signed)
Per EMS pt was 82% on RA. PT arrived on 2L via [place] by EMS, pt noted to be 89% on RA in ED, placed on 2L via  at this time.

## 2016-04-28 NOTE — ED Notes (Signed)
Pt's daughter at bedside at this time.

## 2016-04-28 NOTE — ED Provider Notes (Signed)
[facility] Emergency Department Provider Note   ____________________________________________  Time seen: On EMS arrival  I have reviewed the triage vital signs and the nursing notes.   HISTORY  Chief Complaint Altered Mental Status   History limited by: Demenita   HPI Ruth Juarez is a 82 y.o. female who presents to the emergency department today via EMS because of worsening decline in status. Patient herself is fairly non-verbal and thus is unable to give any significant history. She will state yes or no to questions. EMS states that family told them that the patient not been doing well for the past couple of weeks. The daughter who is the primary caretaker states that she is having a hard time getting the patient up. Additionally she is having a hard time getting the patient in a car for follow-up appointments at doctor's office. EMS got an oral temperature of 99. Per fire department initial O2 sat was in the low 80s. The patient was placed on supplemental oxygen by first responders.   Past Medical History  Diagnosis Date  . Dementia   . Anxiety     Patient Active Problem List   Diagnosis Date Noted  . UTI (lower urinary tract infection) 12/26/2015  . TIA (transient ischemic attack) 10/03/2015  . Alzheimer's dementia with behavioral disturbance 07/03/2015  . Urinary tract infection 07/03/2015    Past Surgical History  Procedure Laterality Date  . Abdominal hysterectomy    . Tonsillectomy      Current Outpatient Rx  Name  Route  Sig  Dispense  Refill  . acetaminophen (TYLENOL) 325 MG tablet   Oral   Take 325 mg by mouth every 6 (six) hours as needed for mild pain or headache.          Marland Kitchen aspirin 81 MG chewable tablet   Oral   Chew 81 mg by mouth daily.         . calcium carbonate (TUMS - DOSED IN MG ELEMENTAL CALCIUM) 500 MG chewable tablet   Oral   Chew 1 tablet by mouth as needed for indigestion or heartburn.          .  cephALEXin (KEFLEX) 500 MG capsule   Oral   Take 1 capsule (500 mg total) by mouth every 12 (twelve) hours.   10 capsule   0   . clotrimazole (LOTRIMIN) 1 % cream   Topical   Apply 1 application topically every 8 (eight) hours as needed (for skin irritation).          Marland Kitchen divalproex (DEPAKOTE ER) 250 MG 24 hr tablet   Oral   Take 250 mg by mouth 2 (two) times daily.         Marland Kitchen escitalopram (LEXAPRO) 10 MG tablet   Oral   Take 10 mg by mouth at bedtime.         Marland Kitchen LORazepam (ATIVAN) 0.5 MG tablet   Oral   Take 1 tablet (0.5 mg total) by mouth every 6 (six) hours as needed for anxiety.   30 tablet   0   . Melatonin 1 MG TABS   Oral   Take 1 mg by mouth at bedtime as needed (for sleep).         . mirtazapine (REMERON) 15 MG tablet   Oral   Take 15 mg by mouth at bedtime.         . Multiple Vitamin (MULTIVITAMIN WITH MINERALS) TABS tablet   Oral   Take 1  tablet by mouth daily.         . risperiDONE (RISPERDAL) 0.5 MG tablet   Oral   Take 1 tablet (0.5 mg total) by mouth 2 (two) times daily as needed (agitation). Patient not taking: Reported on 10/03/2015   60 tablet   0   . risperiDONE (RISPERDAL) 1 MG tablet   Oral   Take 1-2 mg by mouth 2 (two) times daily. Pt takes one tablet in the morning and two tablets at bedtime.         . senna-docusate (SENOKOT-S) 8.6-50 MG tablet   Oral   Take 1 tablet by mouth 2 (two) times daily.         . traMADol (ULTRAM) 50 MG tablet   Oral   Take 25-50 mg by mouth 2 (two) times daily as needed for moderate pain.         . traZODone (DESYREL) 50 MG tablet   Oral   Take 0.5-1 tablets (25-50 mg total) by mouth every 4 (four) hours as needed for sleep (and/or anxiety).   30 tablet   0   . vitamin B-12 (CYANOCOBALAMIN) 1000 MCG tablet   Oral   Take 1,000 mcg by mouth daily.           Allergies Bee venom  Family History  Problem Relation Age of Onset  . Alzheimer's disease Mother   . Alzheimer's disease  Sister   . Breast cancer Sister   . Melanoma Sister     Social History Social History  Substance Use Topics  . Smoking status: Never Smoker   . Smokeless tobacco: Not on file  . Alcohol Use: No     Comment: Patient denies     Review of Systems  Constitutional: Negative for fever. Cardiovascular: Negative for chest pain. Respiratory: Negative for shortness of breath. Gastrointestinal: Negative for abdominal pain, vomiting and diarrhea. Neurological: Negative for headaches, focal weakness or numbness.  10-point ROS otherwise negative.  ____________________________________________   PHYSICAL EXAM:  VITAL SIGNS:   99.2 F (37.3 C)  73   24   174/95 mmHg   89 %      Constitutional: Awake and alert. Chronically ill-appearing. In mild distress. Eyes: Conjunctivae are normal. PERRL. Normal extraocular movements. ENT   Head: Normocephalic and atraumatic.   Nose: No congestion/rhinnorhea.   Mouth/Throat: Mucous membranes are moist.   Neck: No stridor. Hematological/Lymphatic/Immunilogical: No cervical lymphadenopathy. Cardiovascular: Irregularly irregular.  No murmurs, rubs, or gallops. Respiratory: Poor air movement diffusely. Mildly increased respiratory effort. Gastrointestinal: Soft and nontender. No distention. There is no CVA tenderness. Genitourinary: Deferred Musculoskeletal: Normal range of motion in all extremities. No joint effusions.  No lower extremity tenderness nor edema. Neurologic:  Awake and alert. Answers yes or no questions. Appears to be moving all extremities. Skin:  Skin is warm, dry and intact. No rash noted.   ____________________________________________    LABS (pertinent positives/negatives)  Labs Reviewed  COMPREHENSIVE METABOLIC PANEL - Abnormal; Notable for the following:    Glucose, Bld 160 (*)    BUN 27 (*)    ALT 9 (*)    All other components within normal limits  CBC WITH DIFFERENTIAL/PLATELET - Abnormal; Notable for  the following:    RBC 5.42 (*)    RDW 15.1 (*)    All other components within normal limits  URINALYSIS COMPLETEWITH MICROSCOPIC ([facility] ONLY) - Abnormal; Notable for the following:    Color, Urine YELLOW (*)    APPearance HAZY (*)  Ketones, ur 1+ (*)    Leukocytes, UA TRACE (*)    Bacteria, UA RARE (*)    All other components within normal limits  CULTURE, BLOOD (ROUTINE X 2)  CULTURE, BLOOD (ROUTINE X 2)  URINE CULTURE  LACTIC ACID, PLASMA  TROPONIN I  LACTIC ACID, PLASMA     ____________________________________________   EKG  I, Phineas Semen, attending physician, personally viewed and interpreted this EKG  EKG Time: 1023 Rate: 90 Rhythm: normal sinus rhythm Axis: normal Intervals: qtc 425 QRS: narrow, q waves V1, V2 ST changes: no st elevation Impression: abnormal ekg  ____________________________________________    RADIOLOGY  CXR IMPRESSION: Left lower lobe airspace process concerning for pneumonia.  Hyperinflation.  ____________________________________________   PROCEDURES  Procedure(s) performed: None  Critical Care performed: No  ____________________________________________   INITIAL IMPRESSION / ASSESSMENT AND PLAN / ED COURSE  Pertinent labs & imaging results that were available during my care of the patient were reviewed by me and considered in my medical decision making (see chart for details).  Patient presents to the emergency department today because of a decline in status. EMS had an oral temperature of 99. In addition the patient was hypoxic and tachycardic. Because of these symptoms and patient's overall picture a code sepsis was called. At this point think pneumonia or urinary tract infection very likely. Will give patient IV fluids and broad-spectrum antibiotics.  ----------------------------------------- 11:40 AM on 04/28/2016 -----------------------------------------  The patient's blood work is without leukocytosis or  concerning elevation of lactic acid. Chest x-ray is concerning for a pneumonia. Patient does have some white blood cells in the urine however daughter states patient maintains a urinary tract infection. I did have a discussion with the daughter about goals of care. Daughter states that the patient is DO NOT INTUBATE and DO NOT RESUSCITATE. At this point will continue IV antibiotics and plan on admission to the hospital service.  ____________________________________________   FINAL CLINICAL IMPRESSION(S) / ED DIAGNOSES  Final diagnoses:  Community acquired pneumonia  Altered mental status, unspecified altered mental status type     Phineas Semen, MD 04/28/16 1140

## 2016-04-29 LAB — CBC
HEMATOCRIT: 39.6 % (ref 35.0–47.0)
HEMOGLOBIN: 13 g/dL (ref 12.0–16.0)
MCH: 28.3 pg (ref 26.0–34.0)
MCHC: 32.7 g/dL (ref 32.0–36.0)
MCV: 86.4 fL (ref 80.0–100.0)
PLATELETS: 146 10*3/uL — AB (ref 150–440)
RBC: 4.58 MIL/uL (ref 3.80–5.20)
RDW: 14.9 % — ABNORMAL HIGH (ref 11.5–14.5)
WBC: 8.5 10*3/uL (ref 3.6–11.0)

## 2016-04-29 LAB — BASIC METABOLIC PANEL
ANION GAP: 5 (ref 5–15)
BUN: 26 mg/dL — ABNORMAL HIGH (ref 6–20)
CHLORIDE: 109 mmol/L (ref 101–111)
CO2: 29 mmol/L (ref 22–32)
CREATININE: 0.68 mg/dL (ref 0.44–1.00)
Calcium: 8.7 mg/dL — ABNORMAL LOW (ref 8.9–10.3)
GFR calc non Af Amer: >60 mL/min (ref >60)
Glucose, Bld: 85 mg/dL (ref 65–99)
POTASSIUM: 4 mmol/L (ref 3.5–5.1)
SODIUM: 143 mmol/L (ref 135–145)

## 2016-04-29 MED ORDER — LEVOFLOXACIN IN D5W 750 MG/150ML IV SOLN
750.0000 mg | Freq: Once | INTRAVENOUS | Status: AC
  Administered 2016-04-29: 750 mg via INTRAVENOUS
  Filled 2016-04-29: qty 150

## 2016-04-29 MED ORDER — LORAZEPAM 0.5 MG PO TABS: 0.5000 mg | ORAL_TABLET | Freq: Four times a day (QID) | ORAL | Status: DC | PRN

## 2016-04-29 MED ORDER — ACETAMINOPHEN 650 MG RE SUPP
650.0000 mg | Freq: Four times a day (QID) | RECTAL | Status: DC | PRN
  Administered 2016-04-29 – 2016-05-02 (×5): 650 mg via RECTAL
  Filled 2016-04-29 (×7): qty 1

## 2016-04-29 MED ORDER — MIRTAZAPINE 15 MG PO TABS
15.0000 mg | ORAL_TABLET | Freq: Every day | ORAL | Status: DC
Start: 2016-04-29 — End: 2016-05-02

## 2016-04-29 MED ORDER — ESCITALOPRAM OXALATE 10 MG PO TABS
10.0000 mg | ORAL_TABLET | Freq: Every day | ORAL | Status: DC
  Filled 2016-04-29 (×2): qty 1

## 2016-04-29 MED ORDER — LEVOFLOXACIN IN D5W 750 MG/150ML IV SOLN
750.0000 mg | INTRAVENOUS | Status: DC
  Administered 2016-05-01: 750 mg via INTRAVENOUS
  Filled 2016-04-29: qty 150

## 2016-04-29 MED ORDER — ASPIRIN 81 MG PO CHEW
81.0000 mg | CHEWABLE_TABLET | Freq: Every day | ORAL | Status: DC
  Administered 2016-05-01: 81 mg via ORAL
  Filled 2016-04-29: qty 1

## 2016-04-29 NOTE — Progress Notes (Signed)
Pt. Rectal temp 101.9, Dr. Tobi BastosPyreddy notified, new order for tylenol 650 rectally. Blankets removed from pt. Will continue to monitor pt.

## 2016-04-29 NOTE — Progress Notes (Signed)
Confused. Alert to self only. Family was updated on plan of care. NPO due to speech therapy. High flow nasal canal. Suction prn. IV abx. Pt has no further concerns at thsi time.

## 2016-04-29 NOTE — Progress Notes (Signed)
Pharmacy Antibiotic Note  Hubert AzureLouise Freeman is a 82 y.o. female admitted on 04/28/2016 with pneumonia.  Pharmacy has been consulted for Levaquin dosing.  Plan: Will start Levaquin 750mg  IV x 1 and continue with Levaquin 750mg  IV q48h for PNA. Patient with temp spike. Patient on Unasyn.   Height: 5\' 4"  (162.6 cm) Weight: 117 lb 9.6 oz (53.343 kg) IBW/kg (Calculated) : 54.7  Temp (24hrs), Avg:100.3 F (37.9 C), Min:98.9 F (37.2 C), Max:101.9 F (38.8 C)   Recent Labs Lab 04/28/16 1035 04/28/16 1449 04/29/16 0448  WBC 8.0  --  8.5  CREATININE 0.78  --  0.68  LATICACIDVEN 2.0 2.3*  --     Estimated Creatinine Clearance: 42.5 mL/min (by C-G formula based on Cr of 0.68).    Allergies  Allergen Reactions  . Bee Venom Swelling    Antimicrobials this admission:  Vanc x 1  >> 5/14 Zosyn  x1 >> 5/14 Unasyn 5/14 >> Levaquin 5/15 >>  Dose adjustments this admission:    Microbiology results: 5/14 BCx: NGTD 5/14 UCx: pending   Sputum:   5/14 MRSA PCR: negative  Thank you for allowing pharmacy to be a part of this patient's care.  Cosby Proby A 04/29/2016 11:18 AM

## 2016-04-29 NOTE — Progress Notes (Addendum)
[facility] [place] [facility]   PATIENT NAME: Ruth Juarez    MR#:  [id number]  DATE OF BIRTH:  11/25/1927  SUBJECTIVE:   Patient here with pneumonia and respiratory failure  REVIEW OF SYSTEMS:    Review of Systems  Unable to perform ROS: dementia    Tolerating Diet:EOM    DRUG ALLERGIES:   Allergies  Allergen Reactions  . Bee Venom Swelling    VITALS:  Blood pressure 134/55, pulse 93, temperature 100.5 F (38.1 C), temperature source Rectal, resp. rate 16, height 5\' 4"  (1.626 m), weight 53.343 kg (117 lb 9.6 oz), SpO2 99 %.  PHYSICAL EXAMINATION:   Physical Exam  Constitutional: No distress.  Patient with high flow oxygen Critically il appearing  HENT:  Head: Normocephalic.  Eyes: No scleral icterus.  Neck: Normal range of motion. Neck supple. No JVD present. No tracheal deviation present.  Cardiovascular: Exam reveals no gallop and no friction rub.   Murmur heard. Tachycardia  Pulmonary/Chest: She is in respiratory distress. She has no wheezes. She has no rales. She exhibits no tenderness.  Crackles left lung base, increased respiratory rate and work of breathing  Abdominal: Soft. Bowel sounds are normal. She exhibits no distension and no mass. There is no tenderness. There is no rebound and no guarding.  Musculoskeletal: Normal range of motion. She exhibits no edema.  Neurological: She is alert.  Skin: Skin is warm. No rash noted. No erythema.      LABORATORY PANEL:   CBC  Recent Labs Lab 04/29/16 0448  WBC 8.5  HGB 13.0  HCT 39.6  PLT 146*   ------------------------------------------------------------------------------------------------------------------  Chemistries   Recent Labs Lab 04/28/16 1035 04/29/16 0448  NA 141 143  K 4.0 4.0  CL 102 109  CO2 30 29  GLUCOSE 160* 85  BUN 27* 26*  CREATININE 0.78 0.68  CALCIUM 9.2 8.7*  AST 21  --   ALT 9*  --   ALKPHOS 46  --   BILITOT 0.5  --     ------------------------------------------------------------------------------------------------------------------  Cardiac Enzymes  Recent Labs Lab 04/28/16 1035  TROPONINI <0.03   ------------------------------------------------------------------------------------------------------------------  RADIOLOGY:  Dg Chest Port 1 View  04/28/2016  CLINICAL DATA:  Labored breathing, dementia, cough EXAM: PORTABLE CHEST 1 VIEW COMPARISON:  12/26/2015 FINDINGS: Patchy left lower lobe retrocardiac opacity, concerning for left lower lobe pneumonia. Hyperinflation noted. Right lung remains clear. No edema, effusion or pneumothorax. Mild cardiac enlargement. Atherosclerosis noted of the aorta. IMPRESSION: Left lower lobe airspace process concerning for pneumonia. Hyperinflation. Electronically Signed   By: Judie PetitM.  Shick M.D.   On: 04/28/2016 11:16     ASSESSMENT AND PLAN:   82 year old female with dementia and anxiety who presented with acute hypoxic respiratory failure due to pneumonia.  1. Acute hypoxic respiratory failure due to community-acquired pneumonia with the possibility of aspiration: Continue Unasyn and add Levaquin. Continue high flow oxygen with plans to wean as tolerated. Speech consult.  2. Dementia: Continue to monitor for delirium. Continue Risperdal, Remeron and Lexapro and Depakote.  Patient is critically ill. Palliative care consult pending. High risk for decompensation  CODE STATUS: DNR  CRITICAL CARE TOTAL TIME TAKING CARE OF THIS PATIENT: 33 minutes.     POSSIBLE D/C ??, DEPENDING ON CLINICAL CONDITION.   Lionell Matuszak M.D on 04/29/2016 at 11:03 AM  Between 7am to 6pm - Pager - [phone] After 6pm go to [email or web address] - Social research officer, governmentpassword [email or web address]  [employer] [facility]  Office  [phone]  CC: Primary care physician; [facility]  Note: This dictation was prepared with Dragon dictation along with smaller phrase technology. Any transcriptional  errors that result from this process are unintentional.

## 2016-04-29 NOTE — Progress Notes (Signed)
Speech Therapy Note: received order, reviewed chart notes, consulted NSG re: pt's status today. Pt remains on HFNC for increaesed O2 support and minimally arouses for a brief period of time w/ verbal/tactile stim. Pt appears too weak and drowsy w/ declined respiratory status for safe participation in BSE today.  ST will f/u w/ pt's status tomorrow for potential assessment. Recommend continued oral care for hygiene and stimulation of swallowing; strict aspiration precautions w/ such. NSG updated and agreed.

## 2016-04-29 NOTE — Progress Notes (Signed)
Dr. Tobi BastosPyreddy updated on temp, new order for cooling blanket. Will continue to monitor pt.

## 2016-04-30 LAB — BASIC METABOLIC PANEL
ANION GAP: 4 — AB (ref 5–15)
BUN: 27 mg/dL — ABNORMAL HIGH (ref 6–20)
CO2: 29 mmol/L (ref 22–32)
Calcium: 8.7 mg/dL — ABNORMAL LOW (ref 8.9–10.3)
Chloride: 112 mmol/L — ABNORMAL HIGH (ref 101–111)
Creatinine, Ser: 0.64 mg/dL (ref 0.44–1.00)
GFR calc Af Amer: >60 mL/min (ref >60)
Glucose, Bld: 76 mg/dL (ref 65–99)
POTASSIUM: 3.5 mmol/L (ref 3.5–5.1)
Sodium: 145 mmol/L (ref 135–145)

## 2016-04-30 LAB — URINE CULTURE: Culture: >=100000 — AB

## 2016-04-30 MED ORDER — ENOXAPARIN SODIUM 40 MG/0.4ML ~~LOC~~ SOLN
40.0000 mg | SUBCUTANEOUS | Status: DC
  Administered 2016-04-30 – 2016-05-01 (×2): 40 mg via SUBCUTANEOUS
  Filled 2016-04-30 (×3): qty 0.4

## 2016-04-30 NOTE — Care Management Important Message (Signed)
Important Message  Patient Details  Name: Ruth Juarez MRN: [id number] Date of Birth: 01/30/1927   Medicare Important Message Given:  Yes    Olegario MessierKathy A Edword Cu 04/30/2016, 3:12 PM

## 2016-04-30 NOTE — Evaluation (Signed)
Clinical/Bedside Swallow Evaluation Patient Details  Name: Ruth Juarez MRN: [id number] Date of Birth: 06/18/1927  Today's Date: 04/30/2016 Time: SLP Start Time (ACUTE ONLY): 0950 SLP Stop Time (ACUTE ONLY): 1050 SLP Time Calculation (min) (ACUTE ONLY): 60 min  Past Medical History:  Past Medical History  Diagnosis Date  . Dementia   . Anxiety    Past Surgical History:  Past Surgical History  Procedure Laterality Date  . Abdominal hysterectomy    . Tonsillectomy     HPI:  Pt is a 82 y.o. female with a known history of Dementia and anxiety, [place] living in [place] but 2 years ago when she was very sick and Department of Social Services took over and finally her daughter who lives here and was retired went over there and took custody of her and brought her to live with her. Daughter is taking care of her at her home and as per her patient clearly told her she does not want to ever leaving the nursing home. Since December daughter noted patient's condition has been gradually going down up to the level that she cannot go out of the house, and need support with feeding and being confused and irritable frequently. For last 1-2 weeks patient had some cough which really got worse for last 4 days and up to the level that she was staying mostly sleepy was not waking up much to eat also and daughter decided to bring her to emergency room. Pt is now weaned to [place] from HFNC this morning per NSG. Pt continues to present w/ gurgly, wet respirations intermittently. Cough was nonproductive. Pt followed through w/ instruction w/ moderate verbal/tactile cues. Confusion noted.    Assessment / Plan / Recommendation Clinical Impression  Pt appears to present w/ increased risk for aspiration sec. to oropharyngeal phase dysphagia exhibited during assessment this morning. Pt demonstrated oral phase deficits c/b oral holding and delayed A-P transfer; suspect delayed pharyngeal swallow as well. Pt was given  verbal/tactile cues to encourage a pharyngeal swallow - these were delayed in completion. Delayed throat clearing noted post trials of thickened liquid and an ice chip trial. As pt appears at such an increased risk for aspiration sec. to oropharyngeal phase dysphagia and declined cognitive/respiratory status', recommend pt remain NPO w/ oral care w/ NSG staff frequently during the day following aspiration precautions. ST services will f/u w/ ongoing assessment of pt's swallowing in hopes to establish a least restrictive diet consistency tomorrow. NSG updated on poc.     Aspiration Risk  Moderate aspiration risk;Severe aspiration risk    Diet Recommendation  NPO at this time w/ frequent oral care for hygiene and stimulation by NSG staff; aspiration precautions  Medication Administration: Via alternative means    Other  Recommendations Recommended Consults:  (Dietician) Oral Care Recommendations: Oral care QID;Staff/trained caregiver to provide oral care Other Recommendations:  (TBD)   Follow up Recommendations   (TBD)    Frequency and Duration min 3x week  2 weeks       Prognosis Prognosis for Safe Diet Advancement: Guarded Barriers to Reach Goals: Cognitive deficits;Severity of deficits      Swallow Study   General Date of Onset: 04/28/16 HPI: Pt is a 82 y.o. female with a known history of Dementia and anxiety, was living in [place] but 2 years ago when she was very sick and Department of Social Services took over and finally her daughter who lives here and was retired went over there and took custody of  her and brought her to live with her. Daughter is taking care of her at her home and as per her patient clearly told her she does not want to ever leaving the nursing home. Since December daughter noted patient's condition has been gradually going down up to the level that she cannot go out of the house, and need support with feeding and being confused and irritable frequently. For  last 1-2 weeks patient had some cough which really got worse for last 4 days and up to the level that she was staying mostly sleepy was not waking up much to eat also and daughter decided to bring her to emergency room. Pt is now weaned to [place] from HFNC this morning per NSG. Pt continues to present w/ gurgly, wet respirations intermittently. Cough was nonproductive. Pt followed through w/ instruction w/ moderate verbal/tactile cues. Confusion noted.  Type of Study: Bedside Swallow Evaluation Previous Swallow Assessment: none indicated Diet Prior to this Study: NPO (since admission) Temperature Spikes Noted: Yes (wbc not elevated currently) Respiratory Status: Nasal cannula (6 liters) History of Recent Intubation: No (HFNC) Behavior/Cognition: Confused;Pleasant mood;Requires cueing;Distractible (awake) Oral Cavity Assessment: Dry (dried skin) Oral Care Completed by SLP: Yes Oral Cavity - Dentition: Adequate natural dentition Vision:  (n./a) Self-Feeding Abilities: Total assist Patient Positioning: Upright in bed Baseline Vocal Quality: Low vocal intensity Volitional Cough: Congested (did not volitionally cough) Volitional Swallow: Unable to elicit    Oral/Motor/Sensory Function Overall Oral Motor/Sensory Function:  (unable to formally assess sec. to cognition)   Ice Chips Ice chips: Impaired Presentation: Spoon (fed; 4 trials) Oral Phase Impairments: Poor awareness of bolus Oral Phase Functional Implications: Oral holding;Prolonged oral transit Pharyngeal Phase Impairments: Suspected delayed Swallow;Cough - Delayed (x1)   Thin Liquid Thin Liquid: Not tested    Nectar Thick Nectar Thick Liquid: Not tested   Honey Thick Honey Thick Liquid: Impaired Presentation: Spoon (fed; 3 trials) Oral Phase Impairments: Poor awareness of bolus Oral Phase Functional Implications: Oral holding;Prolonged oral transit Pharyngeal Phase Impairments: Suspected delayed Swallow;Throat Clearing - Delayed (x1)    Puree Puree: Impaired Presentation: Spoon (fed; 1 trial) Oral Phase Impairments: Poor awareness of bolus Oral Phase Functional Implications: Oral holding;Prolonged oral transit Pharyngeal Phase Impairments:  (none)   Solid   GO   Solid: Not tested       Jerilynn Som, MS, CCC-SLP  Maekayla Giorgio 04/30/2016,4:22 PM

## 2016-04-30 NOTE — Progress Notes (Signed)
[employer] - [place] at [facility]   PATIENT NAME: Ruth Juarez    MR#:  [id number]  DATE OF BIRTH:  12/13/1927  SUBJECTIVE:  Transitioned to [place] from [place]   REVIEW OF SYSTEMS:    Review of Systems  Unable to perform ROS: dementia    Tolerating Diet: npo    DRUG ALLERGIES:   Allergies  Allergen Reactions  . Bee Venom Swelling    VITALS:  Blood pressure 143/70, pulse 98, temperature 100.3 F (37.9 C), temperature source Rectal, resp. rate 20, height 5\' 4"  (1.626 m), weight 53.343 kg (117 lb 9.6 oz), SpO2 98 %.  PHYSICAL EXAMINATION:   Physical Exam  Constitutional: No distress.  Critically ill  HENT:  Head: Normocephalic.  Eyes: No scleral icterus.  Neck: Normal range of motion. Neck supple. No JVD present. No tracheal deviation present.  Cardiovascular: Normal rate and regular rhythm.  Exam reveals no gallop and no friction rub.   Murmur heard. Tachycardia  Pulmonary/Chest: No respiratory distress. She has wheezes. She has no rales. She exhibits no tenderness.  Crackles left lung base, increased respiratory rate and work of breathing  Abdominal: Soft. Bowel sounds are normal. She exhibits no distension and no mass. There is no tenderness. There is no rebound and no guarding.  Musculoskeletal: Normal range of motion. She exhibits no edema.  Neurological: She is alert.  Skin: Skin is warm. No rash noted. No erythema.  Psychiatric:  Very confused      LABORATORY PANEL:   CBC  Recent Labs Lab 04/29/16 0448  WBC 8.5  HGB 13.0  HCT 39.6  PLT 146*   ------------------------------------------------------------------------------------------------------------------  Chemistries   Recent Labs Lab 04/28/16 1035  04/30/16 0445  NA 141  < > 145  K 4.0  < > 3.5  CL 102  < > 112*  CO2 30  < > 29  GLUCOSE 160*  < > 76  BUN 27*  < > 27*  CREATININE 0.78  < > 0.64  CALCIUM 9.2  < > 8.7*  AST 21  --   --   ALT 9*  --   --    ALKPHOS 46  --   --   BILITOT 0.5  --   --   < > = values in this interval not displayed. ------------------------------------------------------------------------------------------------------------------  Cardiac Enzymes  Recent Labs Lab 04/28/16 1035  TROPONINI <0.03   ------------------------------------------------------------------------------------------------------------------  RADIOLOGY:  No results found.   ASSESSMENT AND PLAN:   82 year old female with dementia and anxiety who presented with acute hypoxic respiratory failure due to pneumonia.  1. Acute hypoxic respiratory failure due to community-acquired pneumonia with the possibility of aspiration: Continue Unasyn and Levaquin. Continue Person with plans to wean as tolerated. Speech consult pending  2. Dementia: Dementia is severe with infection making it worse Continue Risperdal, Remeron and Lexapro and Depakote.  Patient is critically ill. Palliative care consult pending. Still at High risk for decompensation  CODE STATUS: DNR  CRITICAL CARE TOTAL TIME TAKING CARE OF THIS PATIENT: 35 minutes.   Long discussion with daughter about overall prognosis which is very poor  She is ok with hospice at discharge.   POSSIBLE D/C ??, DEPENDING ON CLINICAL CONDITION.   Ruth Juarez M.D on 04/30/2016 at 11:27 AM  Between 7am to 6pm - Pager - [phone] After 6pm go to [email or web address] - Social research officer, governmentpassword [email or web address]  [facility] [place] [facility]  Office  [phone]  CC: Primary care physician; [facility]  Note: This dictation was prepared with Dragon dictation along with smaller phrase technology. Any transcriptional errors that result from this process are unintentional.

## 2016-05-01 LAB — CBC
HEMATOCRIT: 36.7 % (ref 35.0–47.0)
HEMOGLOBIN: 11.9 g/dL — AB (ref 12.0–16.0)
MCH: 27.6 pg (ref 26.0–34.0)
MCHC: 32.6 g/dL (ref 32.0–36.0)
MCV: 84.8 fL (ref 80.0–100.0)
Platelets: 168 10*3/uL (ref 150–440)
RBC: 4.32 MIL/uL (ref 3.80–5.20)
RDW: 15.1 % — ABNORMAL HIGH (ref 11.5–14.5)
WBC: 5.1 10*3/uL (ref 3.6–11.0)

## 2016-05-01 LAB — BASIC METABOLIC PANEL
ANION GAP: 7 (ref 5–15)
BUN: 25 mg/dL — ABNORMAL HIGH (ref 6–20)
CALCIUM: 8.5 mg/dL — AB (ref 8.9–10.3)
CHLORIDE: 114 mmol/L — AB (ref 101–111)
CO2: 27 mmol/L (ref 22–32)
Creatinine, Ser: 0.61 mg/dL (ref 0.44–1.00)
GFR calc non Af Amer: >60 mL/min (ref >60)
Glucose, Bld: 69 mg/dL (ref 65–99)
Potassium: 3.4 mmol/L — ABNORMAL LOW (ref 3.5–5.1)
SODIUM: 148 mmol/L — AB (ref 135–145)

## 2016-05-01 NOTE — Progress Notes (Signed)
Speech Language Pathology Treatment: Dysphagia  Patient Details Name: Ruth Juarez MRN: [id number] DOB: 12/09/1927 Today's Date: 05/01/2016 Time: [id number] SLP Time Calculation (min) (ACUTE ONLY): 60 min  Assessment / Plan / Recommendation Clinical Impression  Pt presents w/ increased risk for aspiration w/ po intake at this time. Pt exhibited overt s/s of aspiration w/ smal TSP trials of thickened liquids and puree foods(honey thick juice, applesauce) c/b multiple, audible swallows and delayed throat clearing. Pt exhibited min. Increased effort w/ respirations briefly but appeared to calm her breathing w/ rest breaks b/t trials. Pt exhibited quick oral phase management/control of boluses w/ suspected delayed pharyngeal swallow response/swallow. Pt required moderate cues when presenting boluses to be fed; total assistance w/ feeding. She appeared to intermittently be distracted and pulled at sheets. All movements were slow and deliberate.  Due to pt's increased risk for aspiration at this time, MD consulted for consideration of initiation of diet. MD to contact Dtr/family re: possible pleasure po's w/ possible Hospice consult dt/ the decline in Cognitive status and status overall. ST will f/u w/ further education on diet and aspiration precautions as needed. NSG updated on poc. If Pleasure po's are started, SLP would recommend a Dysphagia 1 diet w/ Honey consistency liquids by TSP w/ strict aspiration precautions and feeding assistance w/ rest breaks.     HPI HPI: Pt is a 82 y.o. female with a known history of Dementia and anxiety, [place] in [place] but 2 years ago when she was very sick and Department [employer] Social Services took over and finally her daughter who lives here and was retired went over there and took custody of her and brought her to live with her. Daughter is taking care of her at her home and as per her patient clearly told her she does not want to ever leaving the nursing home.  Since December daughter noted patient's condition has been gradually going down up to the level that she cannot go out of the house, and need support with feeding and being confused and irritable frequently. For last 1-2 weeks patient had some cough which really got worse for last 4 days and up to the level that she was staying mostly sleepy was not waking up much to eat also and daughter decided to bring her to emergency room. Pt is now weaned to [place] from [facility]NC this morning per NSG. Pt continues to present w/ gurgly, wet respirations intermittently. Cough was nonproductive. Pt followed through w/ instruction w/ moderate verbal/tactile cues. Confusion noted.       SLP Plan  Continue with current plan of care     Recommendations  Diet recommendations: NPO (Pleasure po's TBD by MD and family) Liquids provided via: Teaspoon Medication Administration: Via alternative means Supervision: Full supervision/cueing for compensatory strategies Compensations: Minimize environmental distractions;Slow rate;Small sips/bites;Multiple dry swallows after each bite/sip Postural Changes and/or Swallow Maneuvers: Seated upright 90 degrees;Upright 30-60 min after meal             General recommendations:  (palliative care consult; Dietician) Oral Care Recommendations: Oral care QID;Staff/trained caregiver to provide oral care Follow up Recommendations:  (TBD) Plan: Continue with current plan of care     GO               Ruth SomKatherine Watson, MS, CCC-SLP  Juarez,Ruth 05/01/2016, 2:14 PM

## 2016-05-01 NOTE — Care Management (Signed)
CM assessment for discharge planning. HX: Dementia. Patient lives at home with daughter who is caretaker.  Admitted with acute respiratory failure, PNA and aspiration. Case discussed with speech therapist, Patient is not taking in enough intake to sustain life. Palliative care consult pending. Hospice has been discussed briefly. CM and attending met with daughter to  discuss hospice vs. hospice home with daughter. She is agreeable to hospice consult and prefers [facility] when presented with choice. Spoke with Santiago Glad with hospice and she is coming to speak with daughter this am.

## 2016-05-01 NOTE — Progress Notes (Addendum)
New referral for [facility] services at home follow discharge received from [facility]. Ruth Juarez was admitted from home on 5/14 for treatment of aspiration PNA. She has received IV  Antibiotics and has required hi flo oxygen up until this morning when she was weaned to 6, then currently 4 liters nasal cannula. She has been seen by speech and felt to be a very high aspiration risk and is currently NPO. With plan to discharge on pleasure feeds per [facility] Lattie Haw.  Indwelling foley catheter in place d/t weakness/lethargy. Writer met in the room with patient's daughter Ocie Cornfield to discuss hospice services. Patient seen sitting up in bed, alert, requesting to go home. She reported pain, but was unable to verbalize where. She continues on IV antibiotics for pneumonia. Diane reports that [name] was, prior to this admission able to ambulate, but had gotten weaker over the last few weeks. She did notice her coughing and "choking" on her food, even when it was "mashed up". She was tolerating melted ice cream, applesauce and pudding at home per Diane's report. [name] was able to answer simple questions, she is oriented to her self and her family. Writer initiated education regarding hospice services, philosophy and team approach to care with understanding voiced by Diane. She repeated several times that she wants her mother to "die at home" and that she "just needs help knowing what to do". Writer provided education regarding dementia progression, the effects of an infection and also the risks of aspiration with continued feeding at home. Diane again voiced understanding.  Patient will need a hospital bed and oxygen delivered PRIOR to discharge home. Delivery has been requested for tomorrow morning as Diane needs to move current furniture to make room for the bed and concentrator.  Patient will require EMS transport at discharge. She is a DNR and will need a signed portable DNR for  transport. CMRN Lattie Haw and attending physician Dr. Benjie Karvonen made aware and are in agreement with discharge plan. Thank you for the opportunity to be involved in the care of this patient and her family. Flo Shanks RN, BSN, [facility] [place], hospital Liaison [phone] c

## 2016-05-01 NOTE — Progress Notes (Signed)
[employer] - [place] at [facility]   PATIENT NAME: Ruth Juarez    MR#:  [id number]  DATE OF BIRTH:  06/21/1927  SUBJECTIVE:  Patient is high risk for aspiration   REVIEW OF SYSTEMS:    Review of Systems  Unable to perform ROS: dementia    Tolerating Diet: npo    DRUG ALLERGIES:   Allergies  Allergen Reactions  . Bee Venom Swelling    VITALS:  Blood pressure 142/86, pulse 97, temperature 98.1 F (36.7 C), temperature source Oral, resp. rate 22, height 5\' 4"  (1.626 m), weight 53.343 kg (117 lb 9.6 oz), SpO2 91 %.  PHYSICAL EXAMINATION:   Physical Exam  Constitutional: No distress.  Critically ill  HENT:  Head: Normocephalic.  Eyes: No scleral icterus.  Neck: Normal range of motion. Neck supple. No JVD present. No tracheal deviation present.  Cardiovascular: Normal rate and regular rhythm.  Exam reveals no gallop and no friction rub.   Murmur heard. Tachycardia  Pulmonary/Chest: No respiratory distress. She has wheezes. She has no rales. She exhibits no tenderness.  Crackles left lung base  Upper airway wheezing   Abdominal: Soft. Bowel sounds are normal. She exhibits no distension and no mass. There is no tenderness. There is no rebound and no guarding.  Musculoskeletal: Normal range of motion. She exhibits no edema.  Neurological: She is alert.  Skin: Skin is warm. No rash noted. No erythema.  Psychiatric:  Confusion       LABORATORY PANEL:   CBC  Recent Labs Lab 05/01/16 0432  WBC 5.1  HGB 11.9*  HCT 36.7  PLT 168   ------------------------------------------------------------------------------------------------------------------  Chemistries   Recent Labs Lab 04/28/16 1035  05/01/16 0432  NA 141  < > 148*  K 4.0  < > 3.4*  CL 102  < > 114*  CO2 30  < > 27  GLUCOSE 160*  < > 69  BUN 27*  < > 25*  CREATININE 0.78  < > 0.61  CALCIUM 9.2  < > 8.5*  AST 21  --   --   ALT 9*  --   --   ALKPHOS 46  --   --    BILITOT 0.5  --   --   < > = values in this interval not displayed. ------------------------------------------------------------------------------------------------------------------  Cardiac Enzymes  Recent Labs Lab 04/28/16 1035  TROPONINI <0.03   ------------------------------------------------------------------------------------------------------------------  RADIOLOGY:  No results found.   ASSESSMENT AND PLAN:   82 year old female with dementia and anxiety who presented with acute hypoxic respiratory failure due to pneumonia.  1. Acute hypoxic respiratory failure due to community-acquired pneumonia with aspiration PNA: Continue Unasyn and Levaquin. Continue [place]. As per speech consultation today she is very high risk of aspiration. Feedings for pleasure with strict aspiration precautions.  Discussed with daughter at bedside.   2. Dementia, moderate to severe: Continue Risperdal, Remeron and Lexapro and Depakote.  Patient will be discharged tomorrow home with hospice care. She'll need oxygen at discharge.   CODE STATUS: DNR  TOTAL TIME TAKING CARE OF THIS PATIENT: 35 minutes.   Discussed with daughter and hospice liaison   POSSIBLE D/C ??, DEPENDING ON CLINICAL CONDITION.   Brandis Wixted M.D on 05/01/2016 at 11:56 AM  Between 7am to 6pm - Pager - [phone] After 6pm go to [email or web address] - password [email or web address] [facility]  [facility] [place] [facility]  Office  [phone]  CC: Primary care physician; [facility]  Note: This dictation was prepared  with Dragon dictation along with smaller phrase technology. Any transcriptional errors that result from this process are unintentional.

## 2016-05-02 LAB — BASIC METABOLIC PANEL
Anion gap: 7 (ref 5–15)
BUN: 19 mg/dL (ref 6–20)
CALCIUM: 8.4 mg/dL — AB (ref 8.9–10.3)
CHLORIDE: 114 mmol/L — AB (ref 101–111)
CO2: 30 mmol/L (ref 22–32)
CREATININE: 0.59 mg/dL (ref 0.44–1.00)
GFR calc Af Amer: >60 mL/min (ref >60)
GFR calc non Af Amer: >60 mL/min (ref >60)
Glucose, Bld: 83 mg/dL (ref 65–99)
Potassium: 3.2 mmol/L — ABNORMAL LOW (ref 3.5–5.1)
SODIUM: 151 mmol/L — AB (ref 135–145)

## 2016-05-02 MED ORDER — MORPHINE SULFATE (PF) 2 MG/ML IV SOLN
1.0000 mg | INTRAVENOUS | Status: DC | PRN
  Administered 2016-05-02: 1 mg via INTRAVENOUS

## 2016-05-02 MED ORDER — GLYCOPYRROLATE 0.2 MG/ML IJ SOLN
0.3000 mg | INTRAMUSCULAR | Status: DC | PRN
  Administered 2016-05-02: 0.3 mg via INTRAVENOUS
  Filled 2016-05-02 (×2): qty 1.5

## 2016-05-02 MED ORDER — MORPHINE SULFATE 20 MG/5ML PO SOLN: 2.5000 mg | ORAL | Status: AC | PRN

## 2016-05-02 MED ORDER — AMOXICILLIN-POT CLAVULANATE 250-62.5 MG/5ML PO SUSR: 500.0000 mg | Freq: Three times a day (TID) | ORAL | Status: AC

## 2016-05-02 MED ORDER — MORPHINE SULFATE (PF) 2 MG/ML IV SOLN
INTRAVENOUS | Status: AC
  Administered 2016-05-02: 1 mg via INTRAVENOUS
  Filled 2016-05-02: qty 1

## 2016-05-02 MED ORDER — TRAZODONE HCL 50 MG PO TABS: 25.0000 mg | ORAL_TABLET | ORAL | Status: AC | PRN

## 2016-05-02 MED ORDER — LEVOFLOXACIN 25 MG/ML PO SOLN: 750.0000 mg | ORAL | Status: AC

## 2016-05-02 NOTE — Clinical Social Work Note (Signed)
Clinical Social Work Assessment  Patient Details  Name: Ruth Juarez MRN: [id number] Date of Birth: 08/28/1927  Date of referral:  05/02/16               Reason for consult:  Transportation, Other (Comment Required) [facility](Hospice Home )                Permission sought to share information with:  Family Supports Permission granted to share information::     Name::        Agency::     Relationship::   (Diane- Daughter)  SolicitorContact Information:     Housing/Transportation Living arrangements for the past 2 months:  Single Family Home Source of Information:  Adult Children (Diane ) Patient Interpreter Needed:  None Criminal Activity/Legal Involvement Pertinent to Current Situation/Hospitalization:  No - Comment as needed Significant Relationships:  Adult Children, Other Family Members Lives with:  Adult Children (Diane- Daughter) Do you feel safe going back to the place where you live?  Yes Need for family participation in patient care:  Yes (Comment) (Diane- Daughter)  Care giving concerns:  Patient's family feels that patient would benefit from services at the Hospice Home. Reports they are unsure if they can care for patient at home.    Social Worker assessment / plan:  CSW was consulted by Charity fundraiserN. CSW was informed that patient was going home with Hospice but the family has changed their mind and would like for her to go to the Hospice Home. CSW spoke with Diane- patient's daughter. She reports that she feels that Hospice Home would be appropriate. CS[facility] and discussed patient's discharge plans. Patient will discharge to Hospice Home today. DNR and RXs are signed on chart. Patient will need EMS to transport at discharge. EMS form on chart. Clinical Social Worker [facility]. Patient's daughter is  in agreement with plan. Patient will [facility]. CSW is signing off. CSW is available  if a need were to arise.    Employment status:  Retired Health and safety inspectornsurance information:  Medicare PT Recommendations:  Not assessed at this time Information / Referral to community resources:  Other (Comment Required) (Hospice Home)  Patient/Family's Response to care:  Patient's daughter was tearful. She reports that she's cared for patient. Stated that she lost her daughter to suicide on today and that her mother going to Hospice is somewhat hard to "deal with". Reported that she feels that Hospice Home is the best option for her mother.   Patient/Family's Understanding of and Emotional Response to Diagnosis, Current Treatment, and Prognosis:  Patient's daughter reports that she's grateful for CSW listening and allowing her to process her feelings of grief. Stated that she somewhat understands patient's prognosis but feels it's in "God's hands".   Emotional Assessment Appearance:  Appears stated age Attitude/Demeanor/Rapport:  Unable to Assess Affect (typically observed):  Unable to Assess Orientation:   (Unable to Assess ) Alcohol / Substance use:  Not Applicable Psych involvement (Current and /or in the community):  No (Comment)  Discharge Needs  Concerns to be addressed:  Other (Comment Required [facility](Hospice Home ) Readmission within the last 30 days:  No Current discharge risk:  Chronically ill Barriers to Discharge:  Continued Medical Work up   WalgreenChristina E Jaykwon Morones, LCSW 05/02/2016, 11:47 AM

## 2016-05-02 NOTE — Progress Notes (Signed)
Follow up visit made to new referral for [facility] services. Patient seen sitting up in bed, now on non-rebreather mask, audible secretions noted, grunting respirations. Writer spoke with attending physician Dr. Benjie Karvonen and staff RN Amy regarding need for symptom management. Patient received 1 mg of IV morphine and 0.3 mg of IV glycopyrrolate. She also received a tylenol suppository prior to Rn visit for elevated temp of 102. Writer contacted patient's daughter Diane via phone, discussed her mother's change in condition. Writer met with Diane in her mother's room to discuss options, Diane chose for her mother to be discharged to the hospice home for symptom management. She repeated several times " I just want her to be taken care and not suffer". Consents signed. Hospital care team made aware of change in discharge plan. Report called to the hospice home, EMS notified for transport. Signed DNR in place in patient's discharge packet. Thank you. Flo Shanks RN, BSN, [place] [facility]  Liaison. [phone] c

## 2016-05-02 NOTE — Discharge Summary (Signed)
[facility] - [place] [facility]   PATIENT NAME: Ruth Juarez    MR#:  [id number]  DATE OF BIRTH:  12/28/1926  DATE OF ADMISSION:  04/28/2016 ADMITTING PHYSICIAN: Altamese DillingVaibhavkumar Vachhani, MD  DATE OF DISCHARGE: 05/02/2016 [facility]    ADMISSION DIAGNOSIS:  Community acquired pneumonia [J18.9] Altered mental status, unspecified altered mental status type [R41.82]  DISCHARGE DIAGNOSIS:  Principal Problem:   Aspiration pneumonia (HCC) Active Problems:   Pneumonia   SECONDARY DIAGNOSIS:   Past Medical History  Diagnosis Date  . Dementia   . Anxiety     HOSPITAL COURSE:    82 year old female with dementia and anxiety who presented with acute hypoxic respiratory failure due to pneumonia.  1. Acute hypoxic respiratory failure due to community-acquired pneumonia with aspiration PNA: This morning patient has had acute decompensation from her respiratory status. She continues to show signs of aspiration. Should a fever of 102.5. Patient is in guarded condition. After conversation with the daughter with the hospice liaison, Clydie BraunKaren patient will be discharged to hospice home.   2. Dementia, moderate to severe: She may ontinue Risperdal, Remeron and Lexapro and Depakote if able to swallow.   DISCHARGE CONDITIONS AND DIET:   Guarded condition feedings as for comfort  CONSULTS OBTAINED:     DRUG ALLERGIES:   Allergies  Allergen Reactions  . Bee Venom Swelling    DISCHARGE MEDICATIONS:   Current Discharge Medication List    START taking these medications   Details  amoxicillin-clavulanate (AUGMENTIN) 250-62.5 MG/5ML suspension Take 10 mLs (500 mg total) by mouth 3 (three) times daily. Qty: 150 mL, Refills: 0    levofloxacin (LEVAQUIN) 25 MG/ML solution Take 30 mLs (750 mg total) by mouth every other day. Qty: 75 mL, Refills: 0    morphine 20 MG/5ML solution Take 0.6 mLs (2.4 mg total) by mouth every 2 (two)  hours as needed for pain. Qty: 30 mL, Refills: 0      CONTINUE these medications which have CHANGED   Details  traZODone (DESYREL) 50 MG tablet Take 0.5 tablets (25 mg total) by mouth every 4 (four) hours as needed for sleep (and/or anxiety). Qty: 30 tablet, Refills: 0      CONTINUE these medications which have NOT CHANGED   Details  acetaminophen (TYLENOL) 325 MG tablet Take 325 mg by mouth every 6 (six) hours as needed for mild pain or headache.     divalproex (DEPAKOTE) 250 MG DR tablet Take 250 mg by mouth 2 (two) times daily.    escitalopram (LEXAPRO) 10 MG tablet Take 10 mg by mouth at bedtime.    LORazepam (ATIVAN) 0.5 MG tablet Take 1 tablet (0.5 mg total) by mouth every 6 (six) hours as needed for anxiety. Qty: 30 tablet, Refills: 0    mirtazapine (REMERON) 15 MG tablet Take 15 mg by mouth at bedtime.    risperiDONE (RISPERDAL) 1 MG tablet Take 1 mg by mouth daily.     traMADol (ULTRAM) 50 MG tablet Take 50 mg by mouth 2 (two) times daily as needed for moderate pain.       STOP taking these medications     aspirin EC 81 MG tablet      senna-docusate (SENOKOT-S) 8.6-50 MG tablet      aspirin 81 MG chewable tablet      cephALEXin (KEFLEX) 500 MG capsule               Today  CHIEF COMPLAINT:   patient With acute decompensation of respiratory status due to aspiration   VITAL SIGNS:  Blood pressure 164/89, pulse 95, temperature 102.5 F (39.2 C), temperature source Rectal, resp. rate 20, height  (1.626 m), weight 53.343 kg (117 lb 9.6 oz), SpO2 95 %.   REVIEW OF SYSTEMS:  Review of Systems  Unable to perform ROS    PHYSICAL EXAMINATION:  GENERAL:  82 y.o.-year-old Critically ill patient with acute distress increased respiratory effort NECK:  Supple, no jugular venous distention. No thyroid enlargement, no tenderness.  LUNGS: Increased respiratory effort increased work of breathing  bilateral crackles.  CARDIOVASCULAR: Tachycardia. No  murmurs, rubs, or gallops.  ABDOMEN: Soft, non-tender, non-distended. Bowel sounds present. No organomegaly or mass.  EXTREMITIES: No pedal edema, cyanosis, or clubbing.  PSYCHIATRIC: The patient is alert critically ill-appearing  SKIN: No obvious rash, lesion, or ulcer.   DATA REVIEW:   CBC  Recent Labs Lab 05/01/16 0432  WBC 5.1  HGB 11.9*  HCT 36.7  PLT 168    Chemistries   Recent Labs Lab 04/28/16 1035  05/02/16 0505  NA 141  < > 151*  K 4.0  < > 3.2*  CL 102  < > 114*  CO2 30  < > 30  GLUCOSE 160*  < > 83  BUN 27*  < > 19  CREATININE 0.78  < > 0.59  CALCIUM 9.2  < > 8.4*  AST 21  --   --   ALT 9*  --   --   ALKPHOS 46  --   --   BILITOT 0.5  --   --   < > = values in this interval not displayed.  Cardiac Enzymes  Recent Labs Lab 04/28/16 1035  TROPONINI <0.03    Microbiology Results  @  RADIOLOGY:  No results found.    Management plans discussed with the patient's daughter and she is in agreement. Stable for discharge hospice home  Patient should follow up with hospice home  CODE STATUS:     Code Status Orders        Start     Ordered   04/28/16 1335  Do not attempt resuscitation (DNR)   Continuous    Question Answer Comment  In the event of cardiac or respiratory ARREST Do not call a "code blue"   In the event of cardiac or respiratory ARREST Do not perform Intubation, CPR, defibrillation or ACLS   In the event of cardiac or respiratory ARREST Use medication by any route, position, wound care, and other measures to relive pain and suffering. May use oxygen, suction and manual treatment of airway obstruction as needed for comfort.   Comments confirmed with daughter, POA      04/28/16 1334    Code Status History    Date Active Date Inactive Code Status Order ID Comments User Context   12/26/2015  5:46 PM 12/28/2015  3:06 PM Full Code [id number]  Houston Siren, MD Inpatient   10/03/2015  8:29 PM 10/04/2015  2:07 PM Full  Code [id number]  Peterson Lombard, RN Inpatient   10/03/2015  7:22 PM 10/03/2015  8:29 PM Full Code [id number]  Gale Journey, MD Inpatient      CRITICAL CARE TOTAL TIME TAKING CARE OF THIS PATIENT: 40 minutes.    Note: This dictation was prepared with Dragon dictation along with smaller phrase technology. Any transcriptional errors that result from this process are unintentional.  Vint Pola M.D on  05/02/2016 at 11:56 AM  Between 7am to 6pm - Pager - [phone] After 6pm go to [email or web address] - Social research officer, government  [facility]  Office  [phone]  CC: Primary care physician; [facility]

## 2016-05-02 NOTE — Progress Notes (Signed)
Palliative Care Consult   I have been updated about pt going home with hospice services.   I have reviewed notes and have spoken with Hospice Liaison about pt.  It appears that a full palliative care consult is not indicated at this time.  I will discontinue the consult order.  Suan HalterMargaret F Laykin Rainone, MD

## 2016-05-02 NOTE — Progress Notes (Signed)
Patient appears to be in pain this morning, shaking and grunting with furrowed eyebrows. Dr. Juliene PinaMody paged and asked about adding PRN IV pain medication.

## 2016-05-02 NOTE — Progress Notes (Signed)
Patient discharged to hospice home via EMS. IV and Tele monitor d/c'd per protocol, foley catheter emptied prior to transport.

## 2016-05-02 NOTE — Progress Notes (Signed)
RN rounding on patient at 0945, found patient flushed, continuing to appear in pain, eyebrows furrowed, grunting. Pt extremely warm to the touch and rectal temp 102.5. PRN PR tylenol given, Dr. Juliene PinaMody at bedside and aware, to place orders for IV morphine.

## 2016-05-03 LAB — CULTURE, BLOOD (ROUTINE X 2)
CULTURE: NO GROWTH
CULTURE: NO GROWTH

## 2016-05-16 DEATH — deceased

## 2016-10-27 IMAGING — CT CT HEAD W/O CM
1 series · 15 of 30 positions shown, 19 images · non-contrast
Comparison: None.

CLINICAL DATA: Confusion, agitation.

EXAM:
CT HEAD WITHOUT CONTRAST
TECHNIQUE: Contiguous axial images were obtained from the base of the skull
through the vertex without intravenous contrast.

[Series 2: head wo · axial · 0.43mm/px · z∈[-127,-1]mm · 15 of 32 slices shown, 19 images]
[im 2/32  brain]
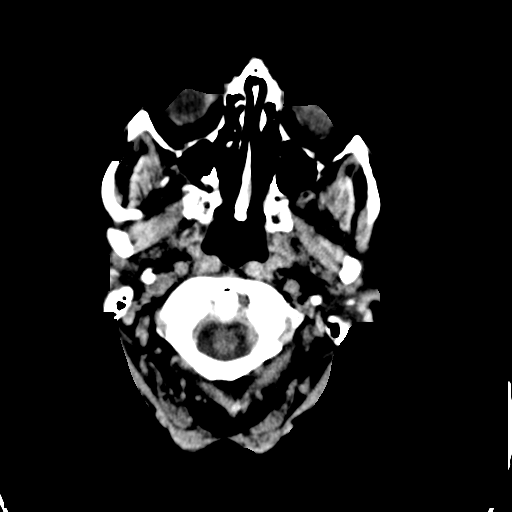
[im 2/32  bone]
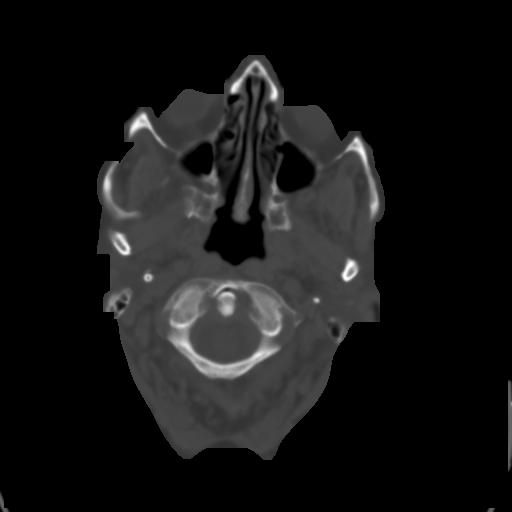
[im 4/32  brain]
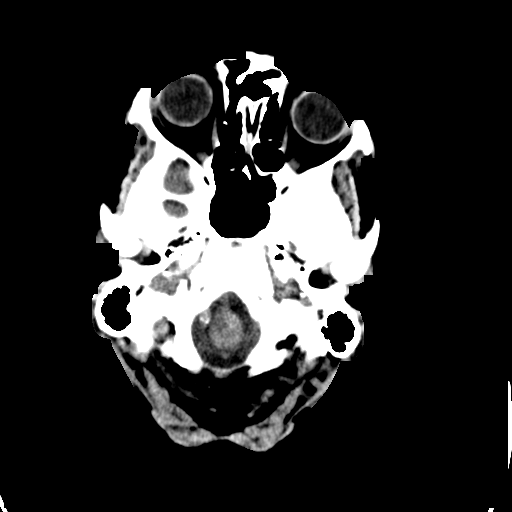
[im 6/32  brain]
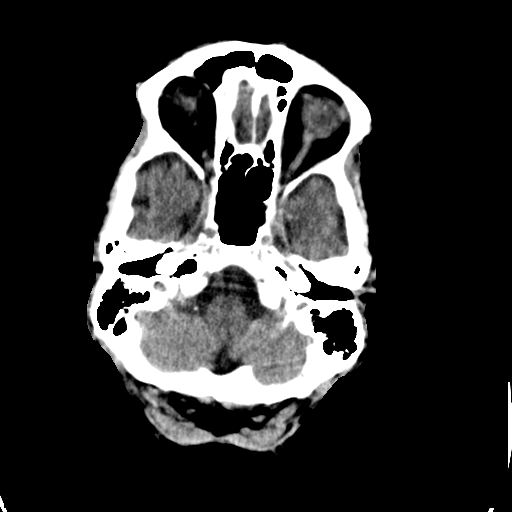
[im 8/32  brain]
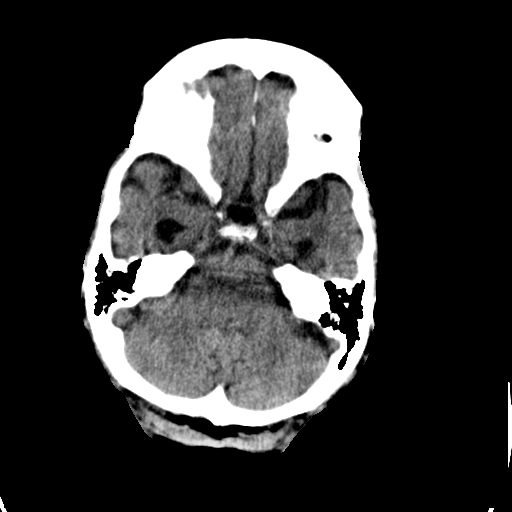
[im 10/32  brain]
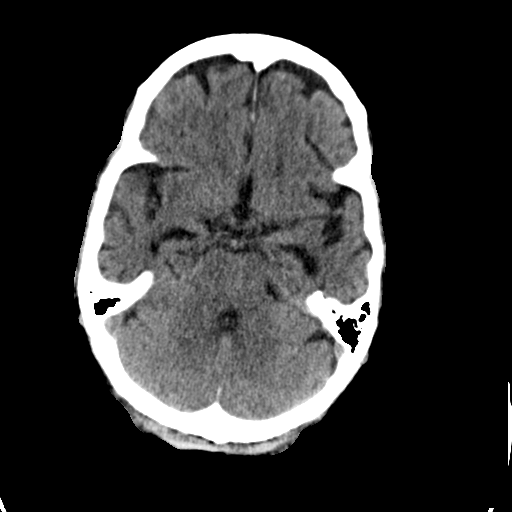
[im 10/32  bone]
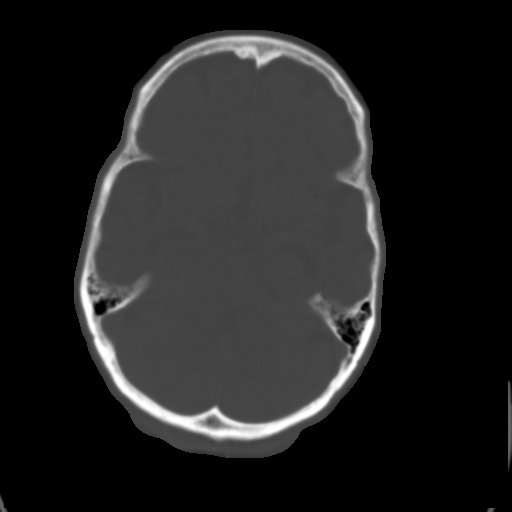
[im 12/32  brain]
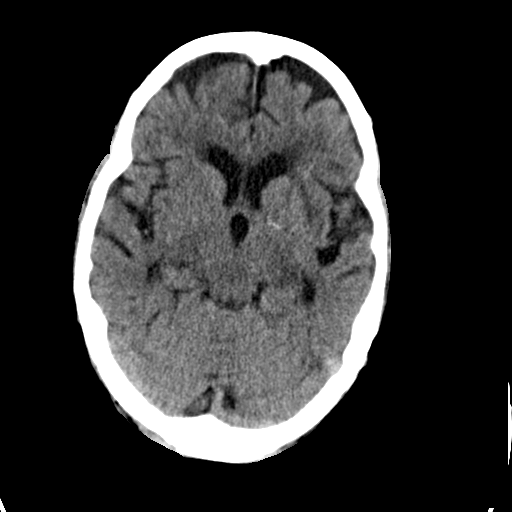
[im 14/32  brain]
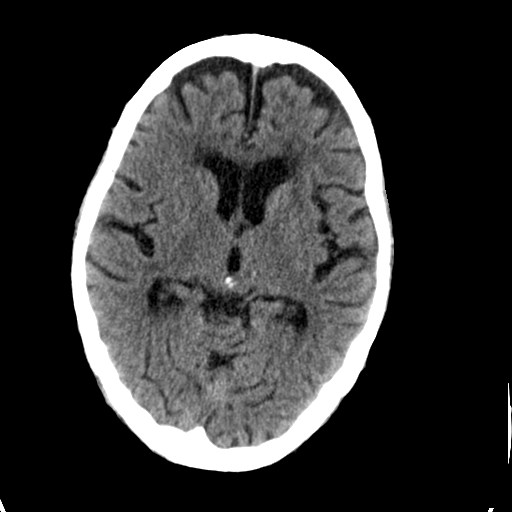
[im 17/32  brain]
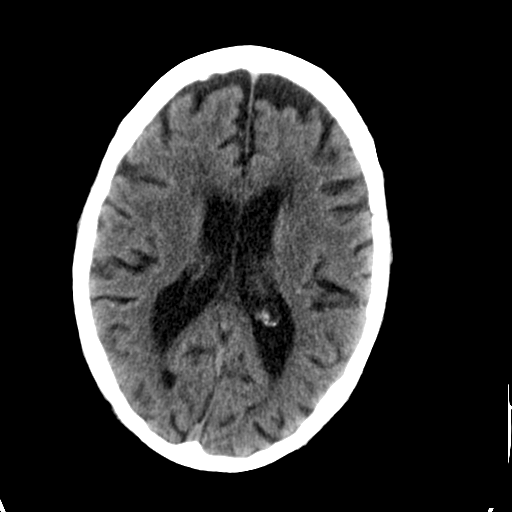
[im 18/32  brain]
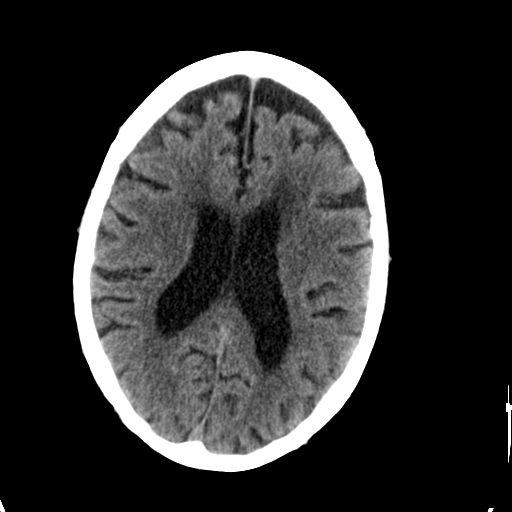
[im 18/32  bone]
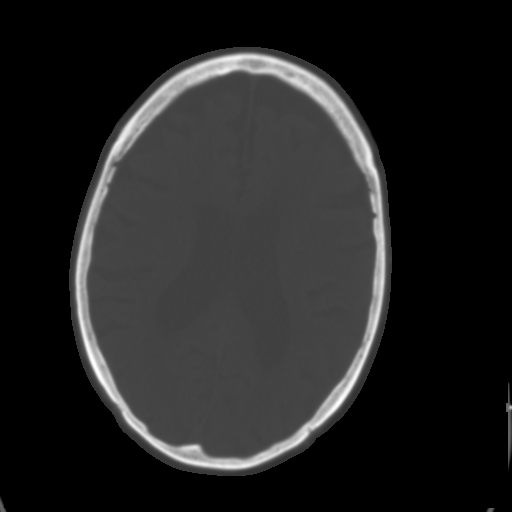
[im 20/32  brain]
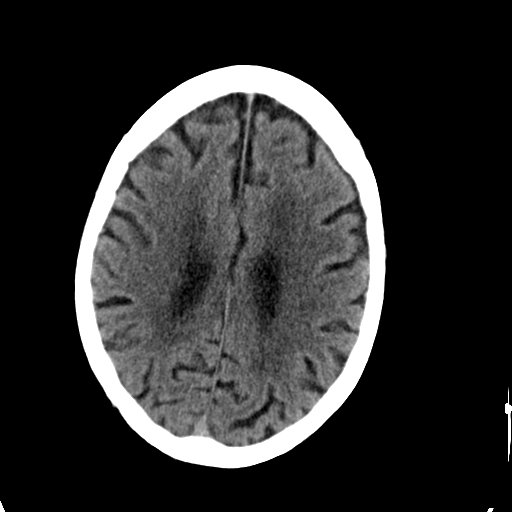
[im 22/32  brain]
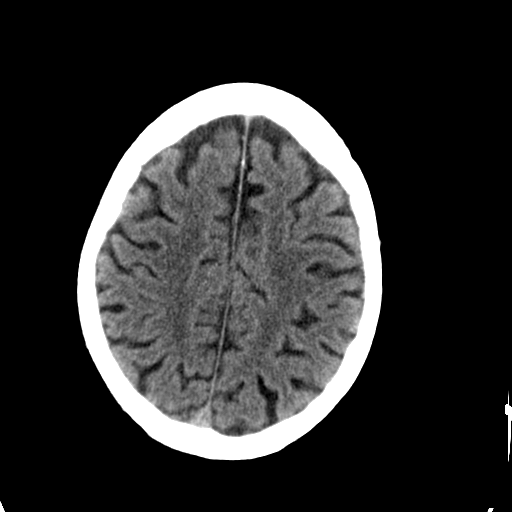
[im 24/32  brain]
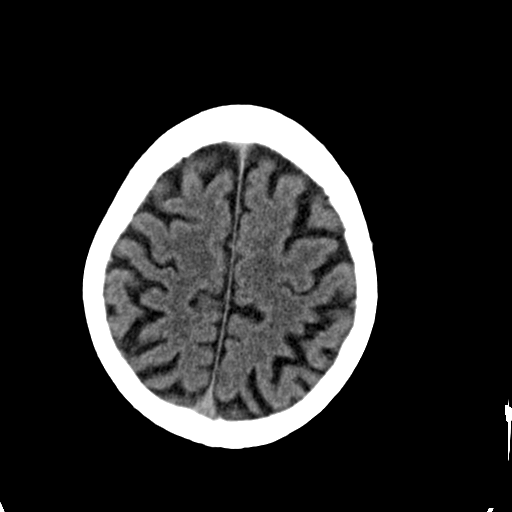
[im 26/32  brain]
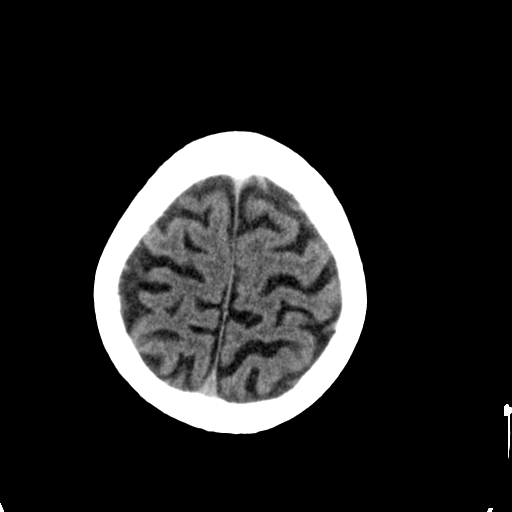
[im 26/32  bone]
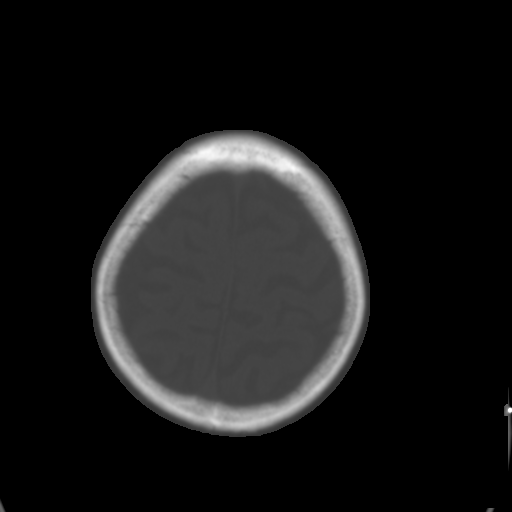
[im 28/32  brain]
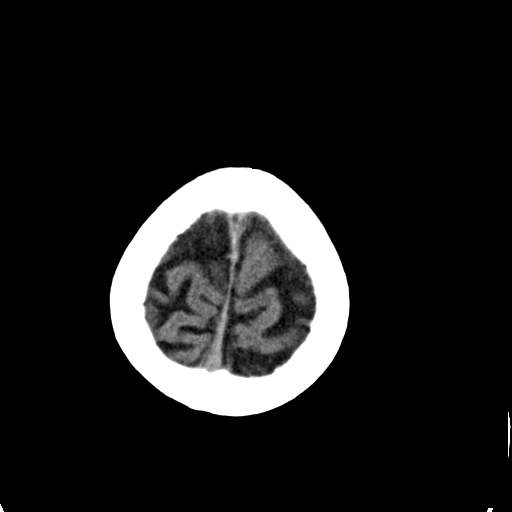
[im 30/32  brain]
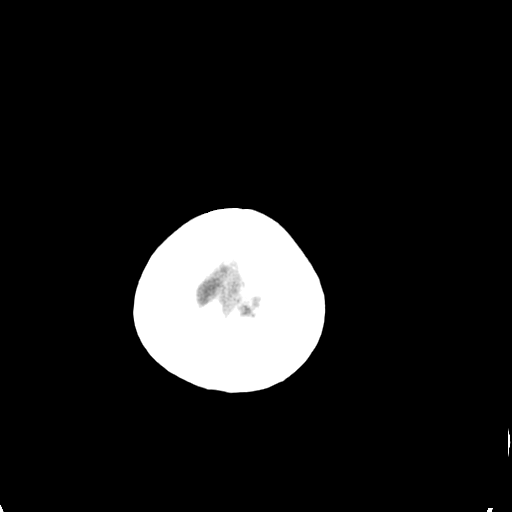

[15 of 30 positions shown; findings below may reference images not displayed]

FINDINGS: The ventricles and sulci are normal for age. No intraparenchymal
hemorrhage, mass effect nor midline shift. Patchy to confluent
supratentorial white matter hypodensities are within normal range
for patient's age and though non-specific suggest sequelae of
chronic small vessel ischemic disease. No acute large vascular
territory infarcts.

No abnormal extra-axial fluid collections. Basal cisterns are
patent. Mild calcific atherosclerosis of the carotid siphons.

No skull fracture. The included ocular globes and orbital contents
are non-suspicious. Status post bilateral ocular lens implants. The
mastoid aircells and included paranasal sinuses are well-aerated.
Moderate temporomandibular osteoarthrosis.
IMPRESSION: No acute intracranial process.

Involutional changes. Moderate to severe white matter changes
compatible with chronic small vessel ischemic disease.

## 2017-08-24 IMAGING — DX DG CHEST 1V PORT
1 series · 1 of 1 positions shown · non-contrast
Comparison: 12/26/2015

CLINICAL DATA: Labored breathing, dementia, cough

EXAM:
PORTABLE CHEST 1 VIEW

[chest ap]
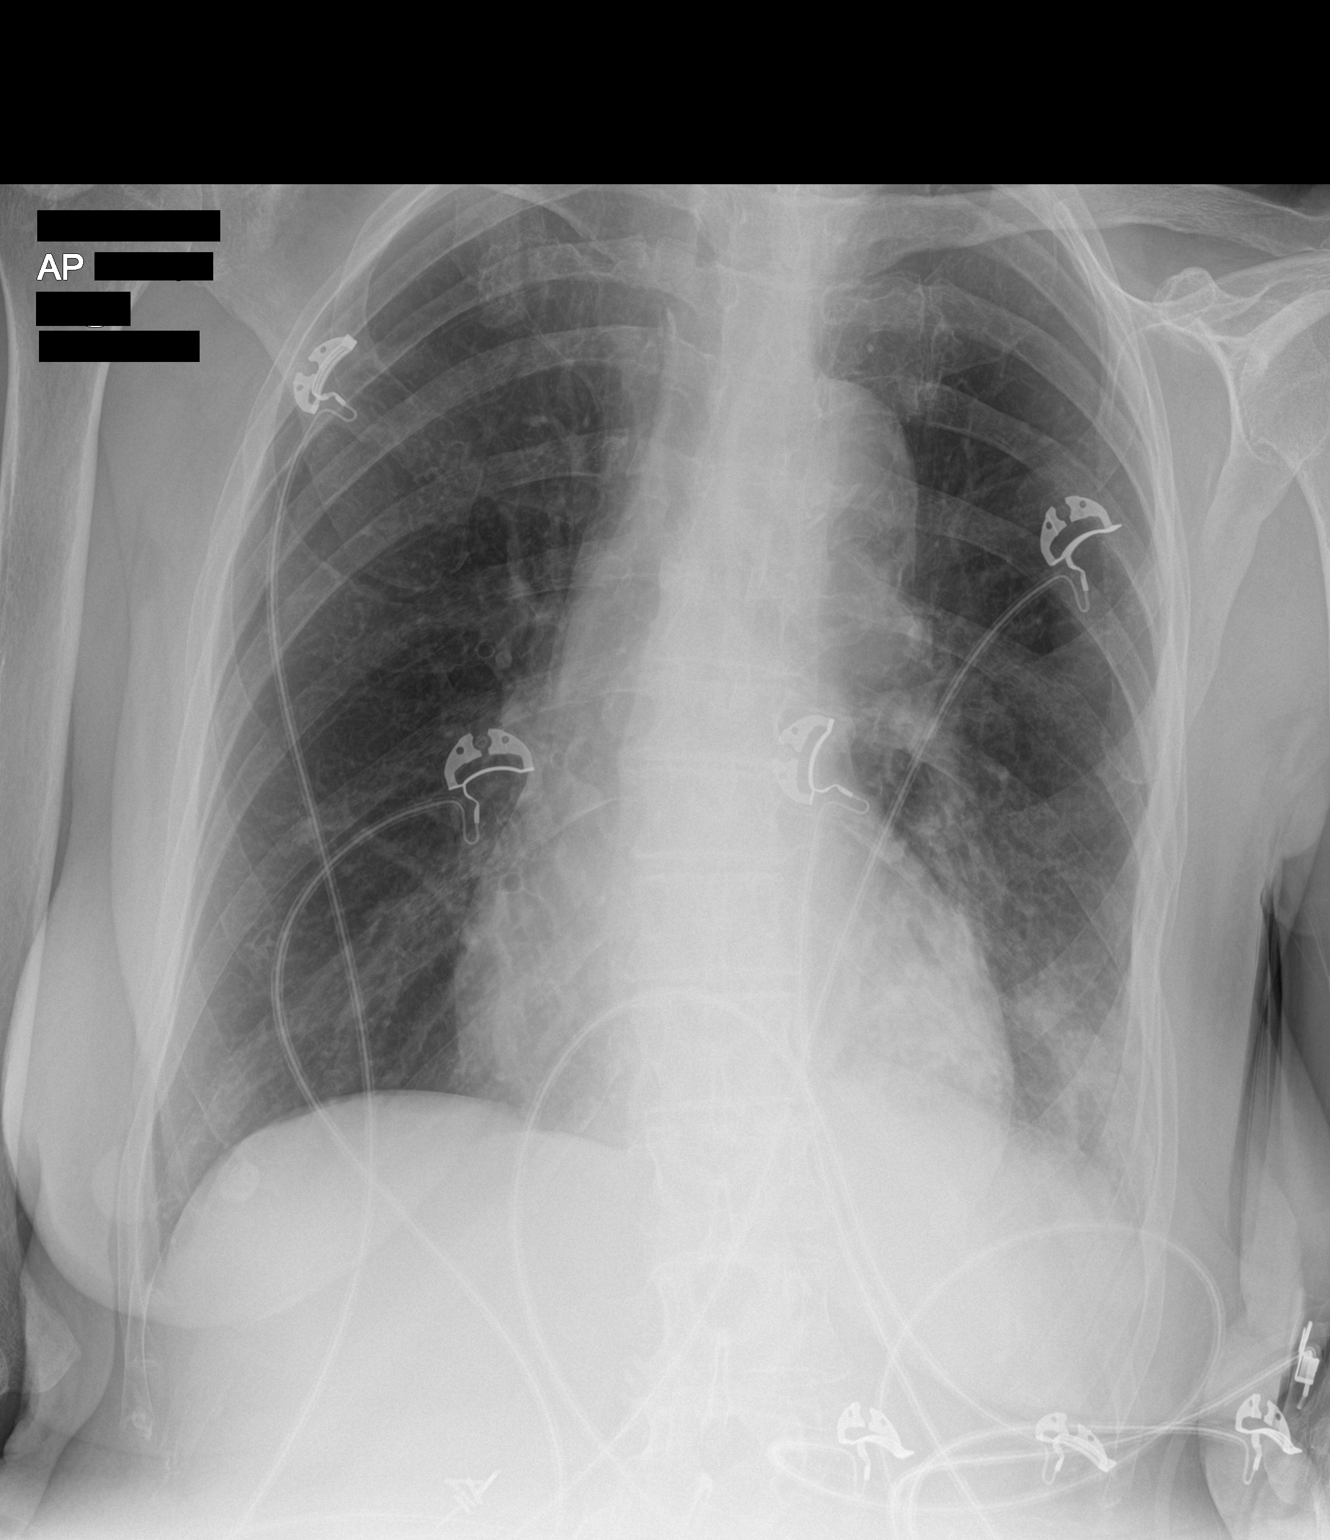

[1 of 1 positions shown; findings below may reference images not displayed]

FINDINGS: Patchy left lower lobe retrocardiac opacity, concerning for left
lower lobe pneumonia. Hyperinflation noted. Right lung remains
clear. No edema, effusion or pneumothorax. Mild cardiac enlargement.
Atherosclerosis noted of the aorta.
IMPRESSION: Left lower lobe airspace process concerning for pneumonia.

Hyperinflation.
